# Patient Record
Sex: Female | Born: 1980
Health system: Southern US, Community
[De-identification: ages and names within clinical notes are randomized; demographics above are authoritative.]

## PROBLEM LIST (undated history)

## (undated) ENCOUNTER — Inpatient Hospital Stay (HOSPITAL_COMMUNITY): Payer: Self-pay

## (undated) HISTORY — PX: NO PAST SURGERIES: SHX2092

---

## 2012-11-04 ENCOUNTER — Other Ambulatory Visit (HOSPITAL_COMMUNITY)
Admission: RE | Admit: 2012-11-04 | Discharge: 2012-11-04 | Disposition: A | Discharge status: Home / Self Care | Payer: BC Managed Care – PPO | Source: Ambulatory Visit

## 2012-11-04 DIAGNOSIS — Z124 Encounter for screening for malignant neoplasm of cervix: Secondary | ICD-10-CM | POA: Insufficient documentation

## 2013-09-03 ENCOUNTER — Emergency Department (HOSPITAL_COMMUNITY): Payer: BC Managed Care – PPO

## 2013-09-03 ENCOUNTER — Emergency Department (HOSPITAL_COMMUNITY)
Admission: EM | Admit: 2013-09-03 | Discharge: 2013-09-03 | Disposition: A | Discharge status: Home / Self Care | Payer: BC Managed Care – PPO | Attending: Emergency Medicine | Admitting: Emergency Medicine

## 2013-09-03 ENCOUNTER — Encounter (HOSPITAL_COMMUNITY): Payer: Self-pay | Admitting: Emergency Medicine

## 2013-09-03 DIAGNOSIS — Y9389 Activity, other specified: Secondary | ICD-10-CM | POA: Insufficient documentation

## 2013-09-03 DIAGNOSIS — Y929 Unspecified place or not applicable: Secondary | ICD-10-CM | POA: Insufficient documentation

## 2013-09-03 DIAGNOSIS — S91109A Unspecified open wound of unspecified toe(s) without damage to nail, initial encounter: Secondary | ICD-10-CM | POA: Insufficient documentation

## 2013-09-03 DIAGNOSIS — S91119A Laceration without foreign body of unspecified toe without damage to nail, initial encounter: Secondary | ICD-10-CM

## 2013-09-03 DIAGNOSIS — W2203XA Walked into furniture, initial encounter: Secondary | ICD-10-CM | POA: Insufficient documentation

## 2013-09-03 MED ORDER — HYDROCODONE-ACETAMINOPHEN 5-325 MG PO TABS: 1.0000 | ORAL_TABLET | ORAL | Status: DC | PRN

## 2013-09-03 NOTE — ED Notes (Signed)
Pt from home. Was sitting in a chair, leaned backwards, and chair broke. Cut right middle and third toe. EMS called. EMS applied sterile dressing to the toes. Bleeding controlled at this time. Pt denies LOC or hitting head when she fell backwards.

## 2013-09-03 NOTE — Discharge Instructions (Signed)

## 2013-09-03 NOTE — ED Provider Notes (Signed)
Medical screening examination/treatment/procedure(s) were performed by non-physician practitioner and as supervising physician I was immediately available for consultation/collaboration.    Olivia Mackielga M Shauntel Prest, MD 09/03/13 Earle [id number]

## 2013-09-03 NOTE — ED Provider Notes (Signed)
CSN: [id number]     Arrival date & time 09/03/13  0018 History   First MD Initiated Contact with Patient 09/03/13 0030     Chief Complaint  Patient presents with  . Extremity Laceration     (Consider location/radiation/quality/duration/timing/severity/associated sxs/prior Treatment) HPI  Patient brought to the ED by EMS after sustaining a toe laceration to her right toe.She was sitting in a chair that somehow folded on itself and she injured her toe. She denies hitting her head, injuring her neck or loc. She was afraid that her "toe meat was hanging out, and i got scared". She is up to date on her tetanus. She says that her foot is not currently hurting that badly.  History reviewed. No pertinent past medical history. History reviewed. No pertinent past surgical history. No family history on file. History  Substance Use Topics  . Smoking status: Never Smoker   . Smokeless tobacco: Not on file  . Alcohol Use: Not on file   OB History   Grav Para Term Preterm Abortions TAB SAB Ect Mult Living                 Review of Systems  The patient denies anorexia, fever, weight loss,, vision loss, decreased hearing, hoarseness, chest pain, syncope, dyspnea on exertion, peripheral edema, balance deficits, hemoptysis, abdominal pain, melena, hematochezia, severe indigestion/heartburn, hematuria, incontinence, genital sores, muscle weakness, suspicious skin lesions, transient blindness, difficulty walking, depression, unusual weight change, abnormal bleeding, enlarged lymph nodes, angioedema, and breast masses.   Allergies  Review of patient's allergies indicates no known allergies.  Home Medications   Current Outpatient Rx  Name  Route  Sig  Dispense  Refill  . HYDROcodone-acetaminophen (NORCO/VICODIN) 5-325 MG per tablet   Oral   Take 1-2 tablets by mouth every 4 (four) hours as needed.   20 tablet   0    BP 123/74  Pulse 79  Temp(Src) 98.1 F (36.7 C) (Oral)  Ht 5\' 3"  (1.6 m)   Wt 214 lb (97.07 kg)  BMI 37.92 kg/m2  SpO2 100%  LMP 07/30/2013 Physical Exam  Nursing note and vitals reviewed. Constitutional: She appears well-developed and well-nourished. No distress.  HENT:  Head: Normocephalic and atraumatic.  Eyes: Pupils are equal, round, and reactive to light.  Neck: Normal range of motion. Neck supple.  Cardiovascular: Normal rate and regular rhythm.   Pulmonary/Chest: Effort normal.  Abdominal: Soft.  Musculoskeletal:  Index toe and middle toe are both tender to palpation and with ROM.  The posterior proximal metatarsal of the middle toe has a small laceration.  Neurological: She is alert.  Skin: Skin is warm and dry.      ED Course  Procedures (including critical care time) Labs Review Labs Reviewed - No data to display Imaging Review Dg Toe 3rd Right  09/03/2013   CLINICAL DATA:  Toe laceration.  EXAM: RIGHT THIRD TOE  COMPARISON:  None.  FINDINGS: There is no evidence of fracture or dislocation. There is no evidence of arthropathy or other focal bone abnormality. Soft tissues are unremarkable. No radiopaque foreign body.  IMPRESSION: Negative.   Electronically Signed   By: Charlett Nose M.D.   On: 09/03/2013 01:07    EKG Interpretation   None       MDM   Final diagnoses:  Toe laceration    LACERATION REPAIR Performed by: Dorthula Matas Authorized by: Dorthula Matas Consent: Verbal consent obtained. Risks and benefits: risks, benefits and alternatives were discussed Consent given by:  patient Patient identity confirmed: provided demographic data Prepped and Draped in normal sterile fashion Wound explored  Laceration Location: right middle and 2nd toe lacerations   Laceration Length: 2 cm  No Foreign Bodies seen or palpated  Anesthesia: local infiltration  Local anesthetic: lidocaine 2 % wo epinephrine  Anesthetic total: 5 ml  Irrigation method: syringe Amount of cleaning: standard  Skin closure:  sutures  Number of sutures: 5  Technique: simple interrupted   Patient tolerance: Patient tolerated the procedure well with no immediate complications.   I placed dermabond over the sutures for further support. Post-op boot to help keep sutures intact. Patient to keep foot dry for 3 years and then has been advised to start washing foot with soap and water so that glue starts to remove. Post-op boot at all times if walking until sutures removed.   Sutures to be removed in 7-10 days.  33 y.o.Pervis HockingLakeithia Denise Boberg's evaluation in the Emergency Department is complete. It has been determined that no acute conditions requiring further emergency intervention are present at this time. The patient/guardian have been advised of the diagnosis and plan. We have discussed signs and symptoms that warrant return to the ED, such as changes or worsening in symptoms.  Vital signs are stable at discharge. Filed Vitals:   09/03/13 0025  BP: 123/74  Pulse: 79  Temp: 98.1 F (36.7 C)    Patient/guardian has voiced understanding and agreed to follow-up with the PCP or specialist.   Dorthula Matasiffany G Joliyah Lippens, PA-C 09/03/13 0200

## 2014-07-22 ENCOUNTER — Emergency Department (HOSPITAL_COMMUNITY)
Admission: EM | Admit: 2014-07-22 | Discharge: 2014-07-22 | Disposition: A | Discharge status: Home / Self Care | Payer: BLUE CROSS/BLUE SHIELD | Attending: Emergency Medicine | Admitting: Emergency Medicine

## 2014-07-22 ENCOUNTER — Encounter (HOSPITAL_COMMUNITY): Payer: Self-pay | Admitting: *Deleted

## 2014-07-22 ENCOUNTER — Emergency Department (HOSPITAL_COMMUNITY): Payer: BLUE CROSS/BLUE SHIELD

## 2014-07-22 DIAGNOSIS — R1030 Lower abdominal pain, unspecified: Secondary | ICD-10-CM

## 2014-07-22 DIAGNOSIS — R109 Unspecified abdominal pain: Secondary | ICD-10-CM | POA: Insufficient documentation

## 2014-07-22 DIAGNOSIS — Z3490 Encounter for supervision of normal pregnancy, unspecified, unspecified trimester: Secondary | ICD-10-CM

## 2014-07-22 DIAGNOSIS — O210 Mild hyperemesis gravidarum: Secondary | ICD-10-CM | POA: Diagnosis not present

## 2014-07-22 DIAGNOSIS — O9989 Other specified diseases and conditions complicating pregnancy, childbirth and the puerperium: Secondary | ICD-10-CM | POA: Diagnosis not present

## 2014-07-22 DIAGNOSIS — Z3A01 Less than 8 weeks gestation of pregnancy: Secondary | ICD-10-CM | POA: Insufficient documentation

## 2014-07-22 DIAGNOSIS — O26899 Other specified pregnancy related conditions, unspecified trimester: Secondary | ICD-10-CM

## 2014-07-22 LAB — URINALYSIS, ROUTINE W REFLEX MICROSCOPIC
GLUCOSE, UA: NEGATIVE mg/dL
Leukocytes, UA: NEGATIVE
Nitrite: NEGATIVE
Protein, ur: 30 mg/dL — AB
SPECIFIC GRAVITY, URINE: 1.037 — AB (ref 1.005–1.030)
UROBILINOGEN UA: 0.2 mg/dL (ref 0.0–1.0)
pH: 6 (ref 5.0–8.0)

## 2014-07-22 LAB — CBC WITH DIFFERENTIAL/PLATELET
BASOS PCT: 0 % (ref 0–1)
Basophils Absolute: 0 10*3/uL (ref 0.0–0.1)
EOS PCT: 0 % (ref 0–5)
Eosinophils Absolute: 0 10*3/uL (ref 0.0–0.7)
HCT: 46.5 % — ABNORMAL HIGH (ref 36.0–46.0)
HEMOGLOBIN: 16.2 g/dL — AB (ref 12.0–15.0)
LYMPHS ABS: 1.4 10*3/uL (ref 0.7–4.0)
Lymphocytes Relative: 18 % (ref 12–46)
MCH: 30.1 pg (ref 26.0–34.0)
MCHC: 34.8 g/dL (ref 30.0–36.0)
MCV: 86.4 fL (ref 78.0–100.0)
MONO ABS: 0.8 10*3/uL (ref 0.1–1.0)
MONOS PCT: 11 % (ref 3–12)
NEUTROS ABS: 5.6 10*3/uL (ref 1.7–7.7)
Neutrophils Relative %: 71 % (ref 43–77)
Platelets: 287 10*3/uL (ref 150–400)
RBC: 5.38 MIL/uL — ABNORMAL HIGH (ref 3.87–5.11)
RDW: 13.6 % (ref 11.5–15.5)
WBC: 7.8 10*3/uL (ref 4.0–10.5)

## 2014-07-22 LAB — WET PREP, GENITAL
Clue Cells Wet Prep HPF POC: NONE SEEN
Trich, Wet Prep: NONE SEEN
Yeast Wet Prep HPF POC: NONE SEEN

## 2014-07-22 LAB — COMPREHENSIVE METABOLIC PANEL
ALK PHOS: 55 U/L (ref 39–117)
ALT: 16 U/L (ref 0–35)
AST: 23 U/L (ref 0–37)
Albumin: 4.3 g/dL (ref 3.5–5.2)
Anion gap: 15 (ref 5–15)
BILIRUBIN TOTAL: 0.7 mg/dL (ref 0.3–1.2)
BUN: 8 mg/dL (ref 6–23)
CALCIUM: 9.7 mg/dL (ref 8.4–10.5)
CHLORIDE: 102 meq/L (ref 96–112)
CO2: 18 mmol/L — ABNORMAL LOW (ref 19–32)
CREATININE: 0.68 mg/dL (ref 0.50–1.10)
GFR calc Af Amer: >90 mL/min (ref >90)
GFR calc non Af Amer: >90 mL/min (ref >90)
GLUCOSE: 92 mg/dL (ref 70–99)
POTASSIUM: 3.5 mmol/L (ref 3.5–5.1)
SODIUM: 135 mmol/L (ref 135–145)
TOTAL PROTEIN: 8.2 g/dL (ref 6.0–8.3)

## 2014-07-22 LAB — URINE MICROSCOPIC-ADD ON

## 2014-07-22 LAB — HCG, QUANTITATIVE, PREGNANCY: hCG, Beta Chain, Quant, S: 38545 m[IU]/mL — ABNORMAL HIGH (ref <5)

## 2014-07-22 LAB — LIPASE, BLOOD: LIPASE: 25 U/L (ref 11–59)

## 2014-07-22 LAB — PREGNANCY, URINE: PREG TEST UR: POSITIVE — AB

## 2014-07-22 MED ORDER — ONDANSETRON 4 MG PO TBDP: ORAL_TABLET | ORAL | Status: AC

## 2014-07-22 MED ORDER — PRENATAL COMPLETE 14-0.4 MG PO TABS: 14.0000 mg | ORAL_TABLET | Freq: Every day | ORAL | Status: AC

## 2014-07-22 MED ORDER — ONDANSETRON HCL 4 MG/2ML IJ SOLN
4.0000 mg | Freq: Once | INTRAMUSCULAR | Status: AC
  Administered 2014-07-22: 4 mg via INTRAVENOUS
  Filled 2014-07-22: qty 2

## 2014-07-22 MED ORDER — SODIUM CHLORIDE 0.9 % IV BOLUS (SEPSIS)
1000.0000 mL | Freq: Once | INTRAVENOUS | Status: AC
  Administered 2014-07-22: 1000 mL via INTRAVENOUS

## 2014-07-22 NOTE — Discharge Instructions (Signed)
Take Zofran as directed as needed for nausea. Take prenatal vitamins daily. Follow-up with the OB/GYN at your PCPs office, or women's hospital. Pelvic rest until follow up with ob/gyn (no intercourse).  First Trimester of Pregnancy The first trimester of pregnancy is from week 1 until the end of week 12 (months 1 through 3). A week after a sperm fertilizes an egg, the egg will implant on the wall of the uterus. This embryo will begin to develop into a baby. Genes from you and your partner are forming the baby. The female genes determine whether the baby is a boy or a girl. At 6-8 weeks, the eyes and face are formed, and the heartbeat can be seen on ultrasound. At the end of 12 weeks, all the baby's organs are formed.  Now that you are pregnant, you will want to do everything you can to have a healthy baby. Two of the most important things are to get good prenatal care and to follow your health care provider's instructions. Prenatal care is all the medical care you receive before the baby's birth. This care will help prevent, find, and treat any problems during the pregnancy and childbirth. BODY CHANGES Your body goes through many changes during pregnancy. The changes vary from woman to woman.   You may gain or lose a couple of pounds at first.  You may feel sick to your stomach (nauseous) and throw up (vomit). If the vomiting is uncontrollable, call your health care provider.  You may tire easily.  You may develop headaches that can be relieved by medicines approved by your health care provider.  You may urinate more often. Painful urination may mean you have a bladder infection.  You may develop heartburn as a result of your pregnancy.  You may develop constipation because certain hormones are causing the muscles that push waste through your intestines to slow down.  You may develop hemorrhoids or swollen, bulging veins (varicose veins).  Your breasts may begin to grow larger and become  tender. Your nipples may stick out more, and the tissue that surrounds them (areola) may become darker.  Your gums may bleed and may be sensitive to brushing and flossing.  Dark spots or blotches (chloasma, mask of pregnancy) may develop on your face. This will likely fade after the baby is born.  Your menstrual periods will stop.  You may have a loss of appetite.  You may develop cravings for certain kinds of food.  You may have changes in your emotions from day to day, such as being excited to be pregnant or being concerned that something may go wrong with the pregnancy and baby.  You may have more vivid and strange dreams.  You may have changes in your hair. These can include thickening of your hair, rapid growth, and changes in texture. Some women also have hair loss during or after pregnancy, or hair that feels dry or thin. Your hair will most likely return to normal after your baby is born. WHAT TO EXPECT AT YOUR PRENATAL VISITS During a routine prenatal visit:  You will be weighed to make sure you and the baby are growing normally.  Your blood pressure will be taken.  Your abdomen will be measured to track your baby's growth.  The fetal heartbeat will be listened to starting around week 10 or 12 of your pregnancy.  Test results from any previous visits will be discussed. Your health care provider may ask you:  How you are feeling.  If you are feeling the baby move.  If you have had any abnormal symptoms, such as leaking fluid, bleeding, severe headaches, or abdominal cramping.  If you have any questions. Other tests that may be performed during your first trimester include:  Blood tests to find your blood type and to check for the presence of any previous infections. They will also be used to check for low iron levels (anemia) and Rh antibodies. Later in the pregnancy, blood tests for diabetes will be done along with other tests if problems develop.  Urine tests to  check for infections, diabetes, or protein in the urine.  An ultrasound to confirm the proper growth and development of the baby.  An amniocentesis to check for possible genetic problems.  Fetal screens for spina bifida and Down syndrome.  You may need other tests to make sure you and the baby are doing well. HOME CARE INSTRUCTIONS  Medicines  Follow your health care provider's instructions regarding medicine use. Specific medicines may be either safe or unsafe to take during pregnancy.  Take your prenatal vitamins as directed.  If you develop constipation, try taking a stool softener if your health care provider approves. Diet  Eat regular, well-balanced meals. Choose a variety of foods, such as meat or vegetable-based protein, fish, milk and low-fat dairy products, vegetables, fruits, and whole grain breads and cereals. Your health care provider will help you determine the amount of weight gain that is right for you.  Avoid raw meat and uncooked cheese. These carry germs that can cause birth defects in the baby.  Eating four or five small meals rather than three large meals a day may help relieve nausea and vomiting. If you start to feel nauseous, eating a few soda crackers can be helpful. Drinking liquids between meals instead of during meals also seems to help nausea and vomiting.  If you develop constipation, eat more high-fiber foods, such as fresh vegetables or fruit and whole grains. Drink enough fluids to keep your urine clear or pale yellow. Activity and Exercise  Exercise only as directed by your health care provider. Exercising will help you:  Control your weight.  Stay in shape.  Be prepared for labor and delivery.  Experiencing pain or cramping in the lower abdomen or low back is a good sign that you should stop exercising. Check with your health care provider before continuing normal exercises.  Try to avoid standing for long periods of time. Move your legs often  if you must stand in one place for a long time.  Avoid heavy lifting.  Wear low-heeled shoes, and practice good posture.  You may continue to have sex unless your health care provider directs you otherwise. Relief of Pain or Discomfort  Wear a good support bra for breast tenderness.   Take warm sitz baths to soothe any pain or discomfort caused by hemorrhoids. Use hemorrhoid cream if your health care provider approves.   Rest with your legs elevated if you have leg cramps or low back pain.  If you develop varicose veins in your legs, wear support hose. Elevate your feet for 15 minutes, 3-4 times a day. Limit salt in your diet. Prenatal Care  Schedule your prenatal visits by the twelfth week of pregnancy. They are usually scheduled monthly at first, then more often in the last 2 months before delivery.  Write down your questions. Take them to your prenatal visits.  Keep all your prenatal visits as directed by your health care provider.  Safety  Wear your seat belt at all times when driving.  Make a list of emergency phone numbers, including numbers for family, friends, the hospital, and police and fire departments. General Tips  Ask your health care provider for a referral to a local prenatal education class. Begin classes no later than at the beginning of month 6 of your pregnancy.  Ask for help if you have counseling or nutritional needs during pregnancy. Your health care provider can offer advice or refer you to specialists for help with various needs.  Do not use hot tubs, steam rooms, or saunas.  Do not douche or use tampons or scented sanitary pads.  Do not cross your legs for long periods of time.  Avoid cat litter boxes and soil used by cats. These carry germs that can cause birth defects in the baby and possibly loss of the fetus by miscarriage or stillbirth.  Avoid all smoking, herbs, alcohol, and medicines not prescribed by your health care provider. Chemicals in  these affect the formation and growth of the baby.  Schedule a dentist appointment. At home, brush your teeth with a soft toothbrush and be gentle when you floss. SEEK MEDICAL CARE IF:   You have dizziness.  You have mild pelvic cramps, pelvic pressure, or nagging pain in the abdominal area.  You have persistent nausea, vomiting, or diarrhea.  You have a bad smelling vaginal discharge.  You have pain with urination.  You notice increased swelling in your face, hands, legs, or ankles. SEEK IMMEDIATE MEDICAL CARE IF:   You have a fever.  You are leaking fluid from your vagina.  You have spotting or bleeding from your vagina.  You have severe abdominal cramping or pain.  You have rapid weight gain or loss.  You vomit blood or material that looks like coffee grounds.  You are exposed to [place] measles and have never had them.  You are exposed to fifth disease or chickenpox.  You develop a severe headache.  You have shortness of breath.  You have any kind of trauma, such as from a fall or a car accident. Document Released: 06/27/2001 Document Revised: 11/17/2013 Document Reviewed: 05/13/2013 [facility] Patient Information 2015 [employer] [place]. This information is not intended to replace advice given to you by your health care provider. Make sure you discuss any questions you have with your health care provider.  Hyperemesis Gravidarum Hyperemesis gravidarum is a severe form of nausea and vomiting that happens during pregnancy. Hyperemesis is worse than morning sickness. It may cause you to have nausea or vomiting all day for many days. It may keep you from eating and drinking enough food and liquids. Hyperemesis usually occurs during the first half (the first 20 weeks) of pregnancy. It often goes away once a woman is in her second half of pregnancy. However, sometimes hyperemesis continues through an entire pregnancy.  CAUSES  The cause of this condition is not completely known  but is thought to be related to changes in the body's hormones when pregnant. It could be from the high level of the pregnancy hormone or an increase in estrogen in the body.  SIGNS AND SYMPTOMS   Severe nausea and vomiting.  Nausea that does not go away.  Vomiting that does not allow you to keep any food down.  Weight loss and body fluid loss (dehydration).  Having no desire to eat or not liking food you have previously enjoyed. DIAGNOSIS  Your health care provider will do a physical exam  and ask you about your symptoms. He or she may also order blood tests and urine tests to make sure something else is not causing the problem.  TREATMENT  You may only need medicine to control the problem. If medicines do not control the nausea and vomiting, you will be treated in the hospital to prevent dehydration, increased acid in the blood (acidosis), weight loss, and changes in the electrolytes in your body that may harm the unborn baby (fetus). You may need IV fluids.  HOME CARE INSTRUCTIONS   Only take over-the-counter or prescription medicines as directed by your health care provider.  Try eating a couple of dry crackers or toast in the morning before getting out of bed.  Avoid foods and smells that upset your stomach.  Avoid fatty and spicy foods.  Eat 5-6 small meals a day.  Do not drink when eating meals. Drink between meals.  For snacks, eat high-protein foods, such as cheese.  Eat or suck on things that have ginger in them. Ginger helps nausea.  Avoid food preparation. The smell of food can spoil your appetite.  Avoid iron pills and iron in your multivitamins until after 3-4 months of being pregnant. However, consult with your health care provider before stopping any prescribed iron pills. SEEK MEDICAL CARE IF:   Your abdominal pain increases.  You have a severe headache.  You have vision problems.  You are losing weight. SEEK IMMEDIATE MEDICAL CARE IF:   You are unable  to keep fluids down.  You vomit blood.  You have constant nausea and vomiting.  You have excessive weakness.  You have extreme thirst.  You have dizziness or fainting.  You have a fever or persistent symptoms for more than 2-3 days.  You have a fever and your symptoms suddenly get worse. MAKE SURE YOU:   Understand these instructions.  Will watch your condition.  Will get help right away if you are not doing well or get worse. Document Released: 07/03/2005 Document Revised: 04/23/2013 Document Reviewed: 02/12/2013 [facility] Patient Information 2015 [place], [place]. This information is not intended to replace advice given to you by your health care provider. Make sure you discuss any questions you have with your health care provider.

## 2014-07-22 NOTE — ED Provider Notes (Signed)
CSN: [id number]     Arrival date & time 07/22/14  1819 History   First MD Initiated Contact with Patient 07/22/14 1903     Chief Complaint  Patient presents with  . Abdominal Pain  . N/V      (Consider location/radiation/quality/duration/timing/severity/associated sxs/prior Treatment) HPI Comments: 34 year old female presenting with abdominal pain, nausea and vomiting 4 days. Abdominal pain radiates across her lower abdomen, constant, worse with certain movements. No alleviating factors. States she has not been able to eat or drink for the past 4 days and is unable to keep anything down. She urinated 4 times over the past few days. No diarrhea. Admits to subjective fever and chills. She went to her PCP earlier today, tested for the flu which was negative, sent to the ED for further evaluation. One month ago she reports being treated for a vaginal infection with both Diflucan and Flagyl, states the symptoms have not completely gone away. Admits to scant vaginal discharge. no vaginal bleeding. She is sexually active with one partner and uses protection. Last menstrual period began December 7 and only lasted 1 day.  Patient is a 34 y.o. female presenting with abdominal pain. The history is provided by the patient.  Abdominal Pain Associated symptoms: chills, fever, nausea, vaginal discharge and vomiting     History reviewed. No pertinent past medical history. History reviewed. No pertinent past surgical history. History reviewed. No pertinent family history. History  Substance Use Topics  . Smoking status: Never Smoker   . Smokeless tobacco: Not on file  . Alcohol Use: Not on file   OB History    No data available     Review of Systems  Constitutional: Positive for fever, chills and appetite change.  Gastrointestinal: Positive for nausea, vomiting and abdominal pain.  Genitourinary: Positive for decreased urine volume and vaginal discharge.  All other systems reviewed and are  negative.     Allergies  Review of patient's allergies indicates no known allergies.  Home Medications   Prior to Admission medications   Medication Sig Start Date End Date Taking? Authorizing Provider  HYDROcodone-acetaminophen (NORCO/VICODIN) 5-325 MG per tablet Take 1-2 tablets by mouth every 4 (four) hours as needed. 09/03/13   Dorthula Matas, PA-C  ondansetron (ZOFRAN ODT) 4 MG disintegrating tablet  ODT q4 hours prn nausea/vomit 07/22/14   Kathrynn Speed, PA-C  Prenatal Vit-Fe Fumarate-FA (PRENATAL COMPLETE) 14-0.4 MG TABS Take 14 mg by mouth daily. 07/22/14   Emmilia Sowder M Willow Reczek, PA-C   BP 100/48 mmHg  Pulse 65  Temp(Src) 98.4 F (36.9 C) (Oral)  Resp 14  Ht  (1.6 m)  Wt 176 lb (79.833 kg)  BMI 31.18 kg/m2  SpO2 98% Physical Exam  Constitutional: She is oriented to person, place, and time. She appears well-developed and well-nourished. No distress.  HENT:  Head: Normocephalic and atraumatic.  Dry MM.  Eyes: Conjunctivae are normal.  Neck: Normal range of motion. Neck supple.  Cardiovascular: Normal rate, regular rhythm and normal heart sounds.   Pulmonary/Chest: Effort normal and breath sounds normal.  Abdominal: Soft. Normal appearance and bowel sounds are normal. There is tenderness. There is no rigidity, no rebound and no guarding.  TTP across lower abdomen. No peritoneal signs.  Genitourinary: Uterus is tender. Cervix exhibits discharge. Cervix exhibits no motion tenderness. Right adnexum displays tenderness. Left adnexum displays tenderness. No tenderness or bleeding in the vagina. Vaginal discharge (yellow/white) found.  Cervical os closed.  Musculoskeletal: Normal range of motion. She exhibits no  edema.  Neurological: She is alert and oriented to person, place, and time.  Skin: Skin is warm and dry. She is not diaphoretic.  Psychiatric: She has a normal mood and affect. Her behavior is normal.  Nursing note and vitals reviewed.   ED Course  Procedures  (including critical care time) Labs Review Labs Reviewed  WET PREP, GENITAL - Abnormal; Notable for the following:    WBC, Wet Prep HPF POC FEW (*)    All other components within normal limits  COMPREHENSIVE METABOLIC PANEL - Abnormal; Notable for the following:    CO2 18 (*)    All other components within normal limits  CBC WITH DIFFERENTIAL - Abnormal; Notable for the following:    RBC 5.38 (*)    Hemoglobin 16.2 (*)    HCT 46.5 (*)    All other components within normal limits  URINALYSIS, ROUTINE W REFLEX MICROSCOPIC - Abnormal; Notable for the following:    Color, Urine AMBER (*)    APPearance CLOUDY (*)    Specific Gravity, Urine 1.037 (*)    Hgb urine dipstick SMALL (*)    Bilirubin Urine SMALL (*)    Ketones, ur >80 (*)    Protein, ur 30 (*)    All other components within normal limits  PREGNANCY, URINE - Abnormal; Notable for the following:    Preg Test, Ur POSITIVE (*)    All other components within normal limits  URINE MICROSCOPIC-ADD ON - Abnormal; Notable for the following:    Squamous Epithelial / LPF MANY (*)    Bacteria, UA FEW (*)    All other components within normal limits  HCG, QUANTITATIVE, PREGNANCY - Abnormal; Notable for the following:    hCG, Beta Chain, Quant, S 16109 (*)    All other components within normal limits  GC/CHLAMYDIA PROBE AMP  LIPASE, BLOOD    Imaging Review US Ob Comp Less 14 Wks  07/22/2014   CLINICAL DATA:  Acute onset of lower abdominal pain. Initial encounter.  EXAM: OBSTETRIC <14 WK [place] AND TRANSVAGINAL OB US  TECHNIQUE: Both transabdominal and transvaginal ultrasound examinations were performed for complete evaluation of the gestation as well as the maternal uterus, adnexal regions, and pelvic cul-de-sac. Transvaginal technique was performed to assess early pregnancy.  COMPARISON:  None.  FINDINGS: Intrauterine gestational sac: Visualized/normal in shape.  Yolk sac:  Yes  Embryo:  Yes  Cardiac Activity: Yes  Heart Rate: 136 bpm  CRL:    8.0  mm   6 w 5 d                  [place] EDC: 03/12/2015  Maternal uterus/adnexae: A small amount of subchorionic hemorrhage is noted distal to the gestational sac. The uterus is otherwise unremarkable.  The ovaries are within normal limits. The right ovary measures 2.6 x 1.9 x 2.1 cm, while the left ovary measures 4.0 x 1.3 x 2.1 cm. No suspicious adnexal masses are seen; there is no evidence for ovarian torsion.  No free fluid is seen within the pelvic cul-de-sac.  IMPRESSION: 1. Single live intrauterine pregnancy noted, with a crown-rump length of 8 mm, corresponding to a gestational age of [redacted] weeks 5 days. This does not match the gestational age by LMP, and reflects a new estimated date of delivery of March 12, 2015. 2. Small amount of subchorionic hemorrhage noted.   Electronically Signed   By: Roanna Raider M.D.   On: 07/22/2014 21:45   US Ob Transvaginal  07/22/2014  CLINICAL DATA:  Acute onset of lower abdominal pain. Initial encounter.  EXAM: OBSTETRIC <14 WK US AND TRANSVAGINAL OB US  TECHNIQUE: Both transabdominal and transvaginal ultrasound examinations were performed for complete evaluation of the gestation as well as the maternal uterus, adnexal regions, and pelvic cul-de-sac. Transvaginal technique was performed to assess early pregnancy.  COMPARISON:  None.  FINDINGS: Intrauterine gestational sac: Visualized/normal in shape.  Yolk sac:  Yes  Embryo:  Yes  Cardiac Activity: Yes  Heart Rate: 136 bpm  CRL:   8.0  mm   6 w 5 d                  US EDC: 03/12/2015  Maternal uterus/adnexae: A small amount of subchorionic hemorrhage is noted distal to the gestational sac. The uterus is otherwise unremarkable.  The ovaries are within normal limits. The right ovary measures 2.6 x 1.9 x 2.1 cm, while the left ovary measures 4.0 x 1.3 x 2.1 cm. No suspicious adnexal masses are seen; there is no evidence for ovarian torsion.  No free fluid is seen within the pelvic cul-de-sac.  IMPRESSION: 1. Single live  intrauterine pregnancy noted, with a crown-rump length of 8 mm, corresponding to a gestational age of [redacted] weeks 5 days. This does not match the gestational age by LMP, and reflects a new estimated date of delivery of March 12, 2015. 2. Small amount of subchorionic hemorrhage noted.   Electronically Signed   By: Roanna RaiderJeffery  Chang M.D.   On: 07/22/2014 21:45     EKG Interpretation None      MDM   Final diagnoses:  Abdominal pain during pregnancy  Hyperemesis gravidarum  Intrauterine pregnancy   Pt in NAD. AFVSS. Lower abdominal pain, nausea, vomiting and decreased appetite for 4 days. Recent treatment for vaginitis. Labs obtained in triage prior to patient being seen, no acute finding. She appears dry. IV fluids started, Zofran given. Urinalysis contaminated with squamous epithelial cells, no evidence of infection. Urine pregnancy positive. After discussion with patient about positive pregnancy, patient states she has an 32-year-old son and has had two elective abortions in the past. Plan to obtain pelvic ultrasound to rule out ectopic pregnancy. 10:06 PM Ultrasound showing single live intrauterine pregnancy, with a crown-rump length of 8 mm, corresponding to a gestational age of [redacted] weeks and 5 days. Small amount of subchorionic hemorrhage. After receiving IV fluids and Zofran, patient reports she feels "so much better". Tolerates PO. Abdomen remains soft, mild tenderness across lower abdomen, improved from initial exam. Will discharge patient home with prenatal vitamins and Zofran. She states there is an OB/GYN at her PCPs office who she will follow-up with. If symptoms worsen, [facility]. Stable for discharge. Return precautions given. Patient states understanding of treatment care plan and is agreeable.  Kathrynn SpeedRobyn M Harce Volden, PA-C 07/22/14 2207  Kathrynn Speedobyn M Cierra Rothgeb, PA-C 07/22/14 2207  Gerhard Munchobert Lockwood, MD 07/23/14 [phone]

## 2014-07-22 NOTE — ED Notes (Signed)
[place] in [place], and she will add HCG to blood on file

## 2014-07-22 NOTE — ED Notes (Signed)
Pt in c/o abd pain with n/v for the last three days, pain is to lower abdomen, went to PCP and tested negative for flu, sent here for further evaluation

## 2014-07-24 ENCOUNTER — Telehealth: Payer: Self-pay | Admitting: *Deleted

## 2014-07-24 LAB — GC/CHLAMYDIA PROBE AMP
CT Probe RNA: NEGATIVE
GC Probe RNA: NEGATIVE

## 2014-07-24 NOTE — Telephone Encounter (Signed)
Pharmacy called r/t not having prescribed prenatal vitamin in stock.  Asked if ok to change to comparable prenatal vitamin in stock.  NCM verified with PA for change.

## 2015-06-21 IMAGING — CR DG TOE 3RD 2+V*R*
3 series · 3 of 3 positions shown · non-contrast
Comparison: None.

CLINICAL DATA: Toe laceration.

EXAM:
RIGHT THIRD TOE

[x toes ap right]
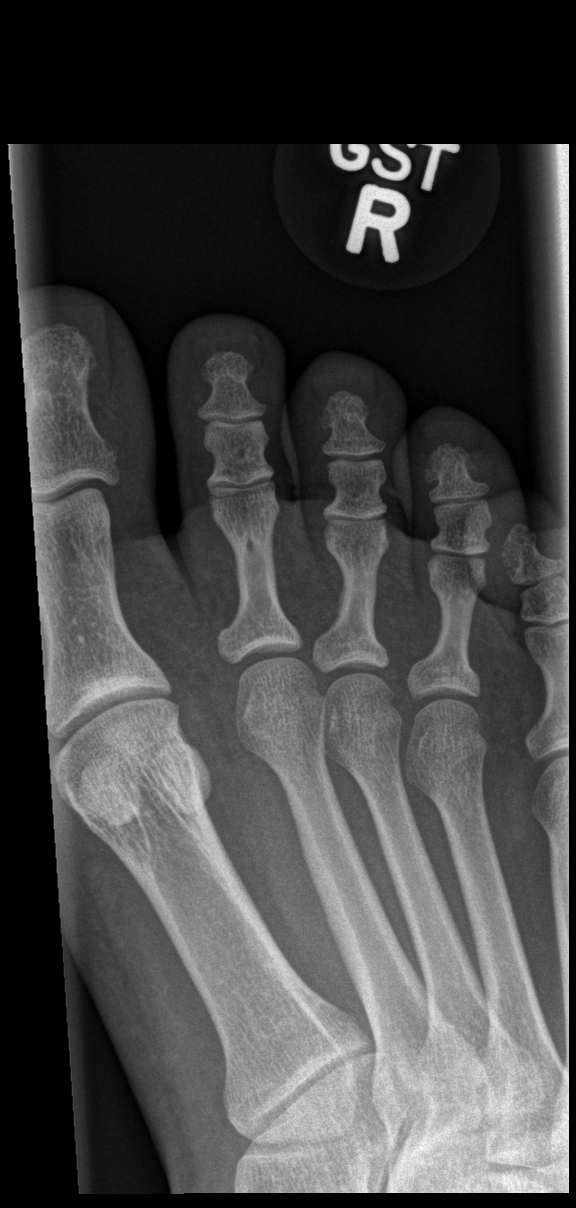

[x toes obl right]
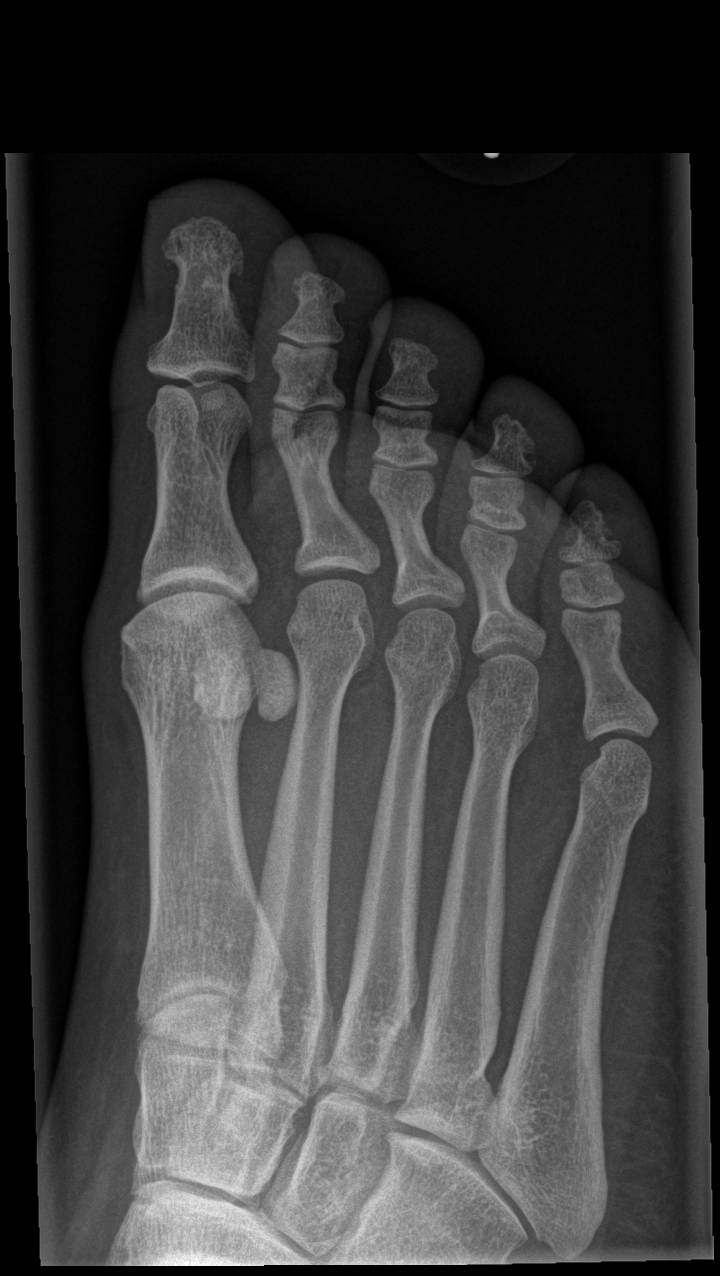

[x toes lat right]
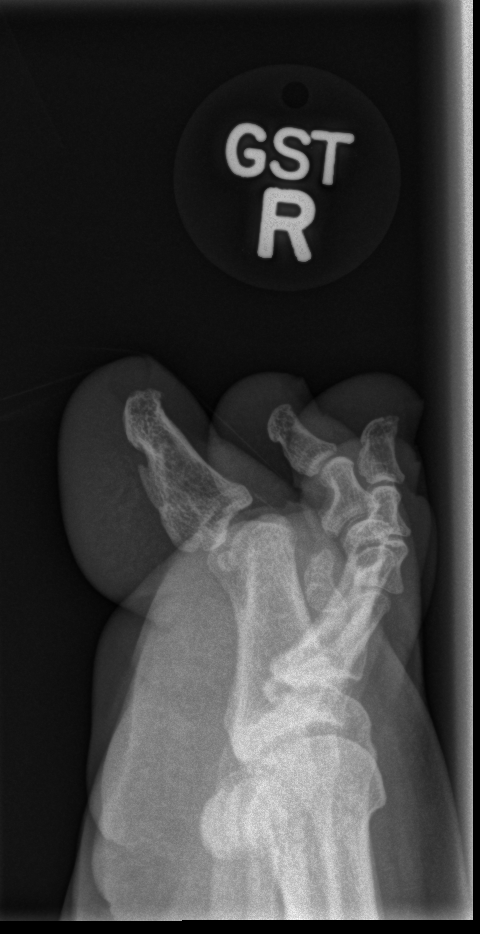

[3 of 3 positions shown; findings below may reference images not displayed]

FINDINGS: There is no evidence of fracture or dislocation. There is no
evidence of arthropathy or other focal bone abnormality. Soft
tissues are unremarkable. No radiopaque foreign body.
IMPRESSION: Negative.

## 2016-02-14 ENCOUNTER — Other Ambulatory Visit: Payer: Self-pay | Admitting: Obstetrics and Gynecology

## 2016-02-14 ENCOUNTER — Other Ambulatory Visit (HOSPITAL_COMMUNITY)
Admission: RE | Admit: 2016-02-14 | Discharge: 2016-02-14 | Disposition: A | Discharge status: Home / Self Care | Payer: Managed Care, Other (non HMO) | Source: Ambulatory Visit | Attending: Obstetrics and Gynecology | Admitting: Obstetrics and Gynecology

## 2016-02-14 DIAGNOSIS — Z01419 Encounter for gynecological examination (general) (routine) without abnormal findings: Secondary | ICD-10-CM | POA: Diagnosis present

## 2016-02-14 DIAGNOSIS — Z113 Encounter for screening for infections with a predominantly sexual mode of transmission: Secondary | ICD-10-CM | POA: Diagnosis present

## 2016-02-14 DIAGNOSIS — Z1151 Encounter for screening for human papillomavirus (HPV): Secondary | ICD-10-CM | POA: Insufficient documentation

## 2016-02-17 LAB — CYTOLOGY - PAP

## 2016-05-08 IMAGING — US US OB COMP LESS 14 WK
1 series · 13 of 28 positions shown · non-contrast
Comparison: None.

CLINICAL DATA: Acute onset of lower abdominal pain. Initial
encounter.

EXAM:
OBSTETRIC <14 WK US AND TRANSVAGINAL OB US
TECHNIQUE: Both transabdominal and transvaginal ultrasound examinations were
performed for complete evaluation of the gestation as well as the
maternal uterus, adnexal regions, and pelvic cul-de-sac.
Transvaginal technique was performed to assess early pregnancy.

[Series 1: us ob comp less 14 wk · 0.22mm/px · 13 of 36 slices shown]
[im 2/36]
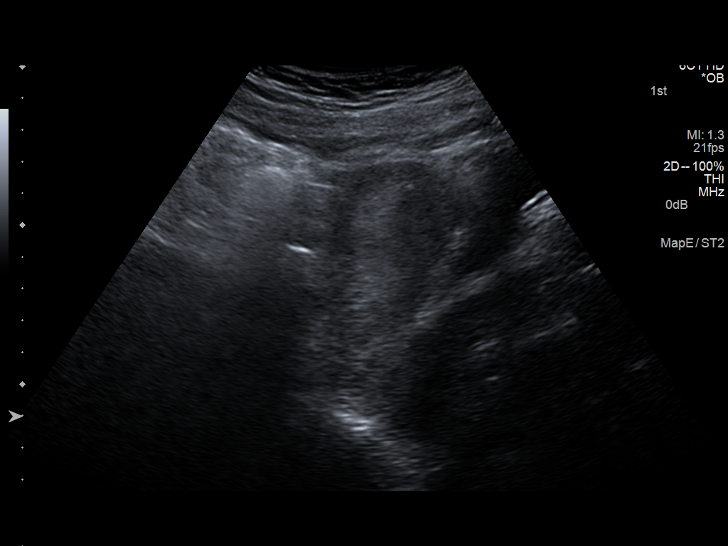
[im 4/36]
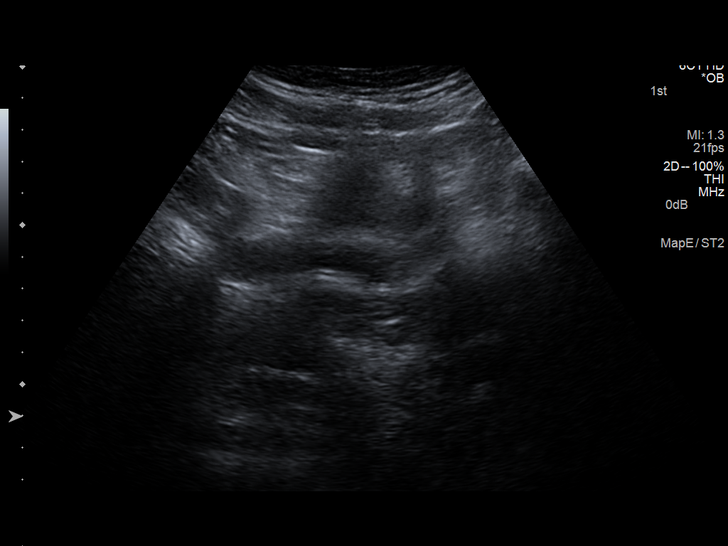
[im 7/36]
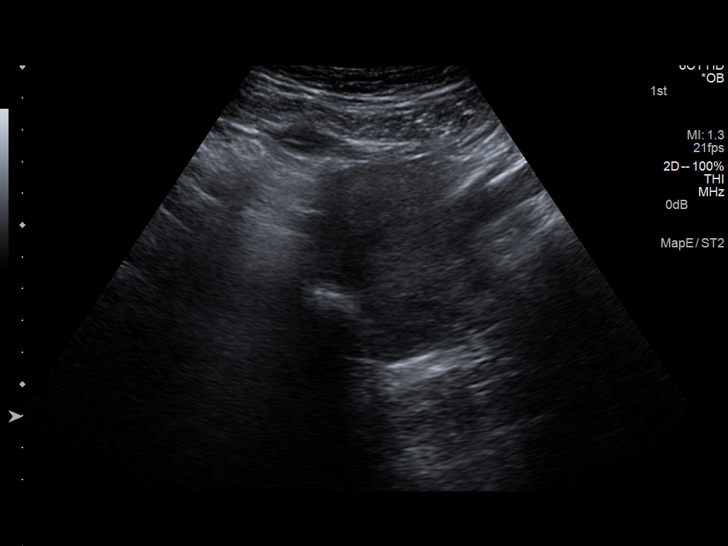
[im 10/36]
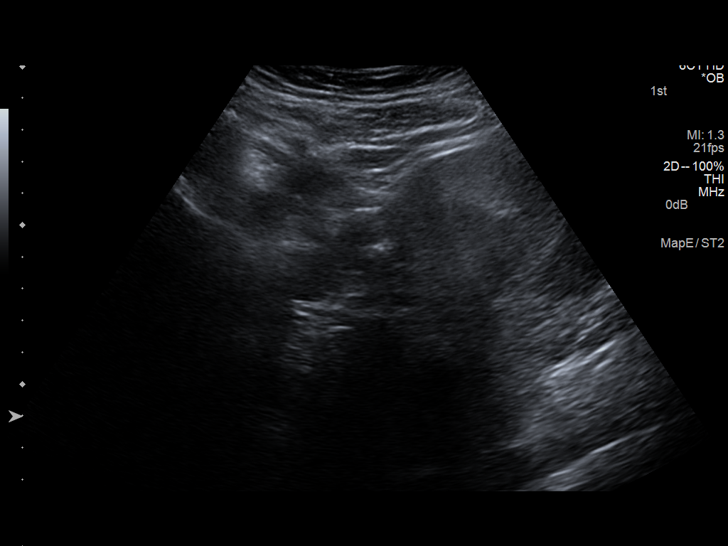
[im 12/36]
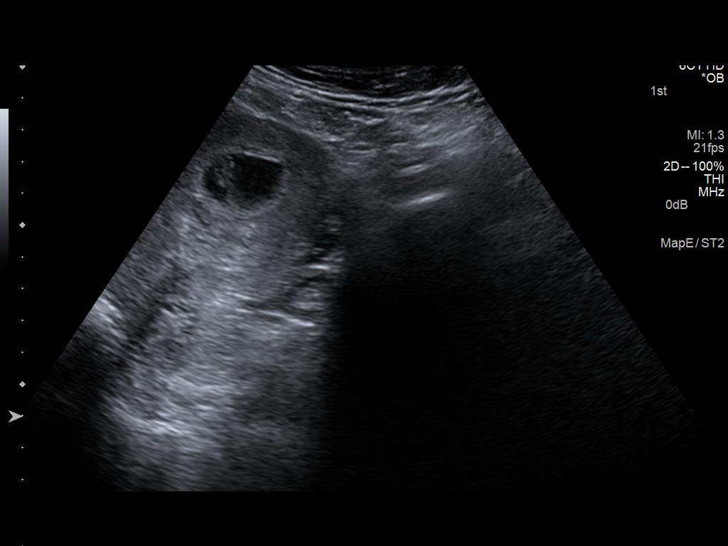
[im 15/36]
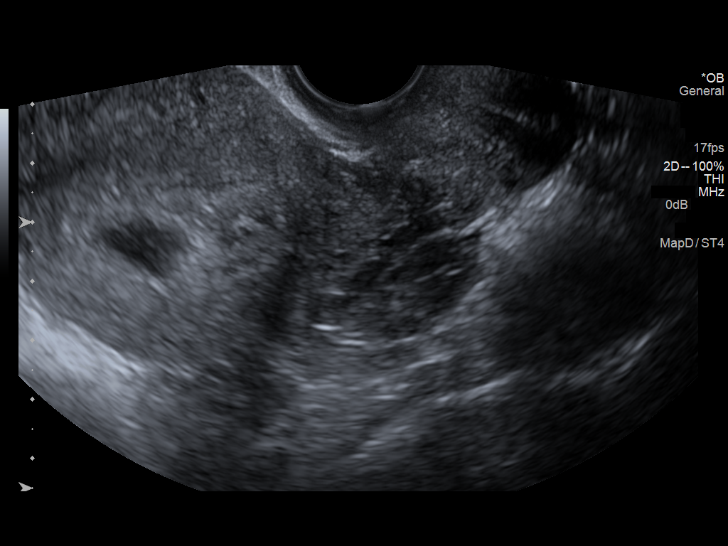
[im 19/36]
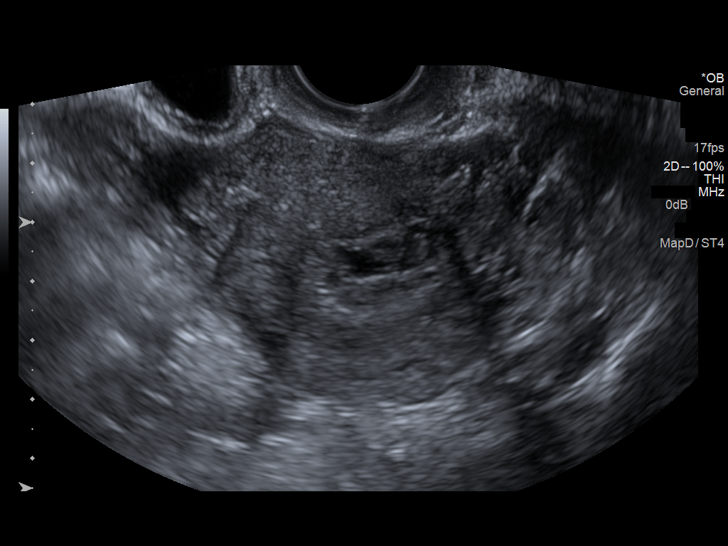
[im 21/36]
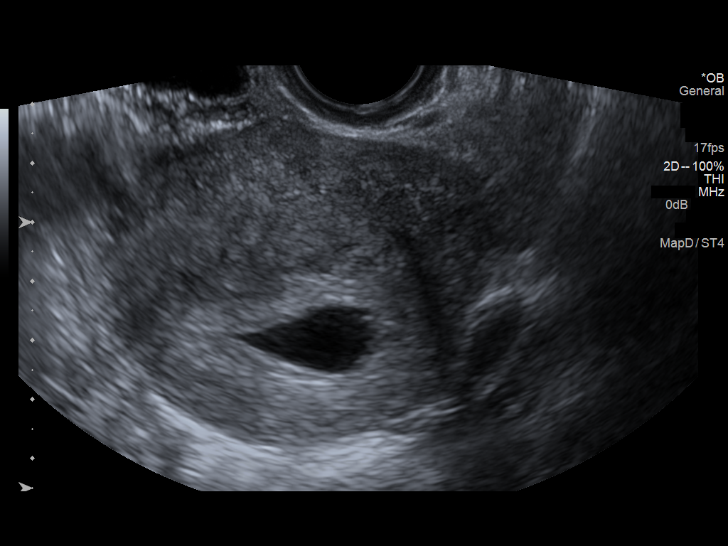
[im 24/36]
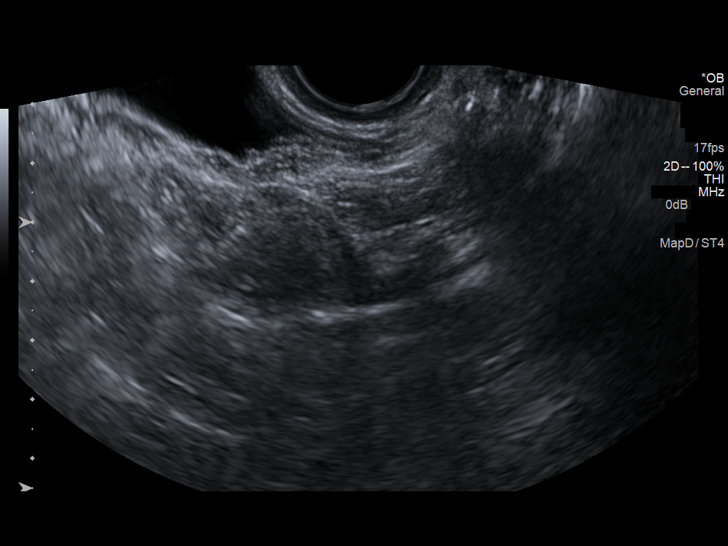
[im 26/36]
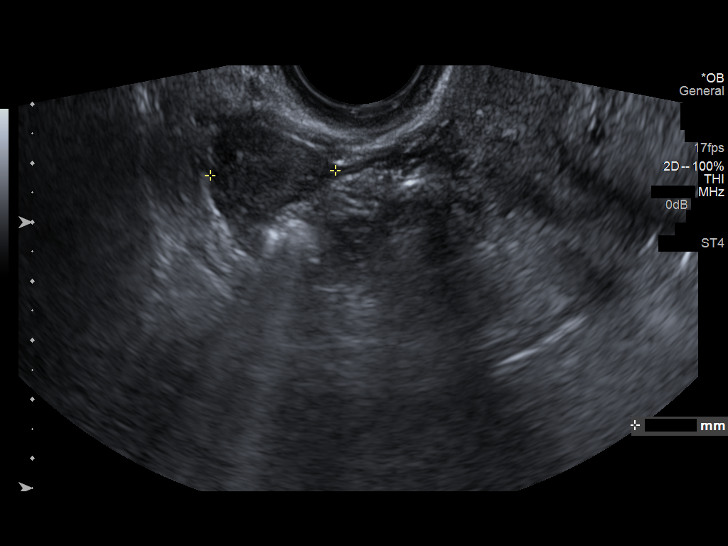
[im 29/36]
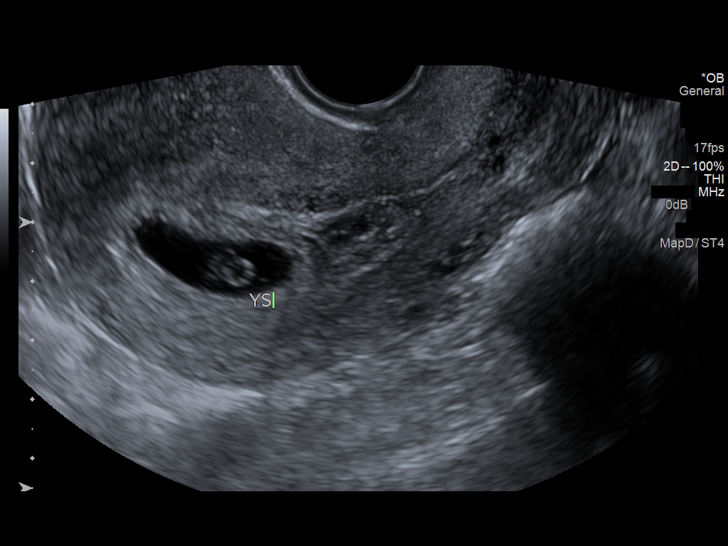
[im 32/36]
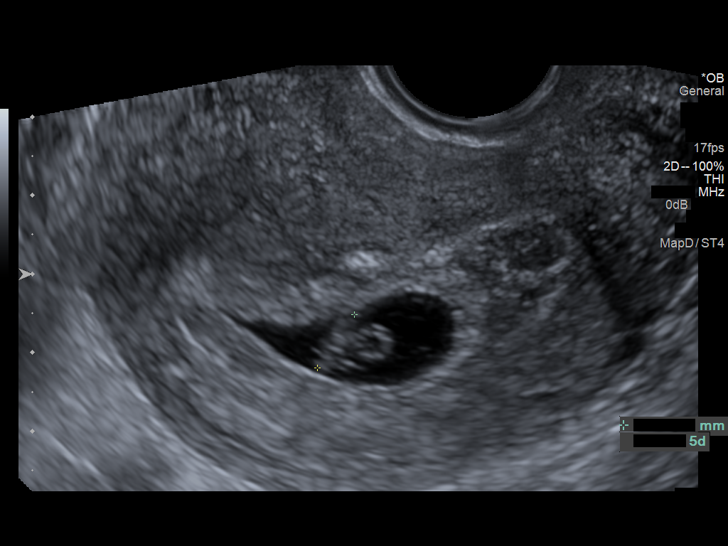
[im 34/36]
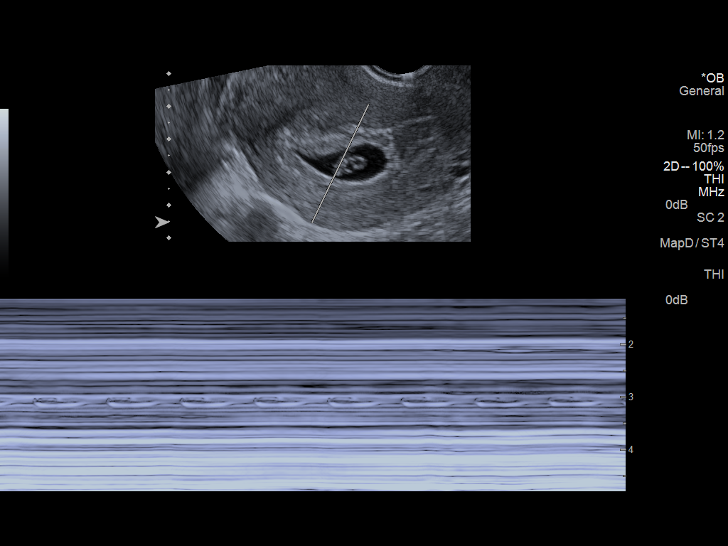

[13 of 28 positions shown; findings below may reference images not displayed]

FINDINGS: Intrauterine gestational sac: Visualized/normal in shape.

Yolk sac:  Yes

Embryo:  Yes

Cardiac Activity: Yes

Heart Rate: 136 bpm

CRL:   8.0  mm   6 w 5 d                  US EDC: 03/12/2015

Maternal uterus/adnexae: A small amount of subchorionic hemorrhage
is noted distal to the gestational sac. The uterus is otherwise
unremarkable.

The ovaries are within normal limits. The right ovary measures 2.6 x
1.9 x 2.1 cm, while the left ovary measures 4.0 x 1.3 x 2.1 cm. No
suspicious adnexal masses are seen; there is no evidence for ovarian
torsion.

No free fluid is seen within the pelvic cul-de-sac.
IMPRESSION: 1. Single live intrauterine pregnancy noted, with a crown-rump
length of 8 mm, corresponding to a gestational age of 6 weeks 5
days. This does not match the gestational age by LMP, and reflects a
new estimated date of delivery March 12, 2015.
2. Small amount of subchorionic hemorrhage noted.

## 2017-03-13 ENCOUNTER — Encounter: Payer: Self-pay | Admitting: Neurology

## 2017-06-25 ENCOUNTER — Ambulatory Visit: Payer: BLUE CROSS/BLUE SHIELD | Admitting: Neurology

## 2017-07-23 ENCOUNTER — Ambulatory Visit: Payer: BLUE CROSS/BLUE SHIELD | Admitting: Neurology

## 2018-04-17 ENCOUNTER — Other Ambulatory Visit: Payer: Self-pay | Admitting: Obstetrics and Gynecology

## 2018-08-02 DIAGNOSIS — Z3201 Encounter for pregnancy test, result positive: Secondary | ICD-10-CM | POA: Diagnosis not present

## 2018-08-06 DIAGNOSIS — R1011 Right upper quadrant pain: Secondary | ICD-10-CM | POA: Diagnosis not present

## 2018-08-06 DIAGNOSIS — O26891 Other specified pregnancy related conditions, first trimester: Secondary | ICD-10-CM | POA: Diagnosis not present

## 2018-08-06 DIAGNOSIS — O219 Vomiting of pregnancy, unspecified: Secondary | ICD-10-CM | POA: Diagnosis not present

## 2018-08-06 DIAGNOSIS — O210 Mild hyperemesis gravidarum: Secondary | ICD-10-CM | POA: Diagnosis not present

## 2018-08-06 DIAGNOSIS — Z888 Allergy status to other drugs, medicaments and biological substances status: Secondary | ICD-10-CM | POA: Diagnosis not present

## 2018-08-06 DIAGNOSIS — Z3A01 Less than 8 weeks gestation of pregnancy: Secondary | ICD-10-CM | POA: Diagnosis not present

## 2018-08-07 DIAGNOSIS — R1011 Right upper quadrant pain: Secondary | ICD-10-CM | POA: Diagnosis not present

## 2018-08-09 ENCOUNTER — Observation Stay (HOSPITAL_COMMUNITY)
Admission: AD | Admit: 2018-08-09 | Discharge: 2018-08-11 | Disposition: A | Discharge status: Home / Self Care | Payer: BLUE CROSS/BLUE SHIELD | Source: Ambulatory Visit | Attending: Obstetrics and Gynecology | Admitting: Obstetrics and Gynecology

## 2018-08-09 ENCOUNTER — Other Ambulatory Visit: Payer: Self-pay

## 2018-08-09 ENCOUNTER — Encounter (HOSPITAL_COMMUNITY): Payer: Self-pay

## 2018-08-09 DIAGNOSIS — Z3481 Encounter for supervision of other normal pregnancy, first trimester: Secondary | ICD-10-CM | POA: Diagnosis not present

## 2018-08-09 DIAGNOSIS — Z3A01 Less than 8 weeks gestation of pregnancy: Secondary | ICD-10-CM | POA: Diagnosis not present

## 2018-08-09 DIAGNOSIS — Z3201 Encounter for pregnancy test, result positive: Secondary | ICD-10-CM | POA: Diagnosis not present

## 2018-08-09 DIAGNOSIS — E876 Hypokalemia: Secondary | ICD-10-CM | POA: Insufficient documentation

## 2018-08-09 DIAGNOSIS — O210 Mild hyperemesis gravidarum: Secondary | ICD-10-CM | POA: Diagnosis not present

## 2018-08-09 DIAGNOSIS — O09891 Supervision of other high risk pregnancies, first trimester: Secondary | ICD-10-CM

## 2018-08-09 DIAGNOSIS — Z3490 Encounter for supervision of normal pregnancy, unspecified, unspecified trimester: Secondary | ICD-10-CM | POA: Diagnosis not present

## 2018-08-09 LAB — BASIC METABOLIC PANEL
Anion gap: 11 (ref 5–15)
BUN: 7 mg/dL (ref 6–20)
CO2: 25 mmol/L (ref 22–32)
Calcium: 8.8 mg/dL — ABNORMAL LOW (ref 8.9–10.3)
Chloride: 98 mmol/L (ref 98–111)
Creatinine, Ser: 0.71 mg/dL (ref 0.44–1.00)
GFR calc Af Amer: >60 mL/min (ref >60)
Glucose, Bld: 100 mg/dL — ABNORMAL HIGH (ref 70–99)
Potassium: 2.9 mmol/L — ABNORMAL LOW (ref 3.5–5.1)
Sodium: 134 mmol/L — ABNORMAL LOW (ref 135–145)

## 2018-08-09 LAB — URINALYSIS, ROUTINE W REFLEX MICROSCOPIC
Bilirubin Urine: NEGATIVE
Glucose, UA: >=500 mg/dL — AB
Hgb urine dipstick: NEGATIVE
Ketones, ur: 80 mg/dL — AB
Leukocytes, UA: NEGATIVE
Nitrite: NEGATIVE
Protein, ur: NEGATIVE mg/dL
Specific Gravity, Urine: 1.028 (ref 1.005–1.030)
pH: 6 (ref 5.0–8.0)

## 2018-08-09 LAB — LIPASE, BLOOD: LIPASE: 31 U/L (ref 11–51)

## 2018-08-09 LAB — AMYLASE: Amylase: 59 U/L (ref 28–100)

## 2018-08-09 MED ORDER — ACETAMINOPHEN 325 MG PO TABS: 650.0000 mg | ORAL_TABLET | ORAL | Status: DC | PRN

## 2018-08-09 MED ORDER — CALCIUM CARBONATE ANTACID 500 MG PO CHEW: 2.0000 | CHEWABLE_TABLET | ORAL | Status: DC | PRN

## 2018-08-09 MED ORDER — PRENATAL MULTIVITAMIN CH: 1.0000 | ORAL_TABLET | Freq: Every day | ORAL | Status: DC

## 2018-08-09 MED ORDER — DEXTROSE 5 % IN LACTATED RINGERS IV BOLUS
1000.0000 mL | Freq: Once | INTRAVENOUS | Status: AC
  Administered 2018-08-09: 1000 mL via INTRAVENOUS

## 2018-08-09 MED ORDER — ZOLPIDEM TARTRATE 5 MG PO TABS: 5.0000 mg | ORAL_TABLET | Freq: Every evening | ORAL | Status: DC | PRN

## 2018-08-09 MED ORDER — PROMETHAZINE HCL 25 MG/ML IJ SOLN: 12.5000 mg | INTRAMUSCULAR | Status: DC | PRN

## 2018-08-09 MED ORDER — LACTATED RINGERS IV SOLN
INTRAVENOUS | Status: DC
  Administered 2018-08-09 – 2018-08-11 (×6): via INTRAVENOUS

## 2018-08-09 MED ORDER — ONDANSETRON HCL 4 MG/2ML IJ SOLN
4.0000 mg | Freq: Three times a day (TID) | INTRAMUSCULAR | Status: DC
  Administered 2018-08-09 – 2018-08-10 (×4): 4 mg via INTRAVENOUS
  Filled 2018-08-09 (×4): qty 2

## 2018-08-09 MED ORDER — PYRIDOXINE HCL 100 MG/ML IJ SOLN
50.0000 mg | Freq: Two times a day (BID) | INTRAMUSCULAR | Status: DC
  Administered 2018-08-09 – 2018-08-10 (×3): 50 mg via INTRAVENOUS
  Filled 2018-08-09 (×5): qty 0.5

## 2018-08-09 MED ORDER — POTASSIUM CHLORIDE 2 MEQ/ML IV SOLN
INTRAVENOUS | Status: DC
  Filled 2018-08-09 (×4): qty 500

## 2018-08-09 MED ORDER — DOCUSATE SODIUM 100 MG PO CAPS
100.0000 mg | ORAL_CAPSULE | Freq: Every day | ORAL | Status: DC
Start: 2018-08-09 — End: 2018-08-11
  Filled 2018-08-09: qty 1

## 2018-08-09 NOTE — H&P (Signed)
Julie Carr is a 38 y.o. female G6 P1041 @ 7 4/7 weeks by ultrasound on 08/06/18 admitted for hyperemesis. Pt reports she has been vomiting "constantly" for the last 2 days.  Pt was seen at [facility] for the same issue on 08/07/18.  She states she received 3 bags of fluids and given IV medication.  She was tolerating liquids at discharge.  She reports the next morning vomiting resumed.  She was prescribed Diclegis but could not fill b/c it was very expensive.  Pt reports she vomited when she arrived to hospital ~2-3 hours ago but she has not vomited since IV medications started.  Pt reports she has vomited over 5 times this am.  She can not hold down water.  She feels weak and lethargic. Pt denies cramping and bleeding.  OB History    Gravida  5   Para  1   Term  1   Preterm  0   AB  4   Living  1     SAB  1   TAB  3   Ectopic  0   Multiple  0   Live Births  1          History reviewed. No pertinent past medical history. No past surgical history on file. Family History: family history is not on file. Social History:  reports that she has never smoked. She does not have any smokeless tobacco history on file. She reports that she does not use drugs. No history on file for alcohol.   Review of Systems  Constitutional: Negative for malaise/fatigue.  Gastrointestinal: Positive for nausea and vomiting.   Maternal Medical History:  Reason for admission: Nausea.       Blood pressure 121/72, pulse 70, temperature 97.8 F (36.6 C), temperature source Oral, resp. rate 18, height 5\' 3"  (1.6 m), weight 88 kg, SpO2 100 %. Maternal Exam:  Abdomen: Patient reports no abdominal tenderness.   Physical Exam  Constitutional: She is oriented to person, place, and time. She appears well-developed and well-nourished. She appears distressed.  Pt appears weak  HENT:  Head: Normocephalic and atraumatic.  Eyes sunken. Dark circles under eyes.  Neck: Normal range of  motion.  Cardiovascular: Normal rate and regular rhythm.  Respiratory: Effort normal and breath sounds normal.  GI: Soft. There is no abdominal tenderness.  Musculoskeletal: Normal range of motion.        General: No tenderness or edema.  Neurological: She is alert and oriented to person, place, and time.  Skin: Skin is warm.  Lips dry and cracked.    CMP     Component Value Date/Time   NA 134 (L) 08/09/2018 1237   K 2.9 (L) 08/09/2018 1237   CL 98 08/09/2018 1237   CO2 25 08/09/2018 1237   GLUCOSE 100 (H) 08/09/2018 1237   BUN 7 08/09/2018 1237   CREATININE 0.71 08/09/2018 1237   CALCIUM 8.8 (L) 08/09/2018 1237   PROT 8.2 07/22/2014 1836   ALBUMIN 4.3 07/22/2014 1836   AST 23 07/22/2014 1836   ALT 16 07/22/2014 1836   ALKPHOS 55 07/22/2014 1836   BILITOT 0.7 07/22/2014 1836   GFRNONAA >60 08/09/2018 1237   GFRAA >60 08/09/2018 1237   Urinalysis    Component Value Date/Time   COLORURINE YELLOW 08/09/2018 1538   APPEARANCEUR HAZY (A) 08/09/2018 1538   LABSPEC 1.028 08/09/2018 1538   PHURINE 6.0 08/09/2018 1538   GLUCOSEU >=500 (A) 08/09/2018 1538   HGBUR NEGATIVE 08/09/2018  1538   BILIRUBINUR NEGATIVE 08/09/2018 1538   KETONESUR 80 (A) 08/09/2018 1538   PROTEINUR NEGATIVE 08/09/2018 1538   UROBILINOGEN 0.2 07/22/2014 1912   NITRITE NEGATIVE 08/09/2018 1538   LEUKOCYTESUR NEGATIVE 08/09/2018 1538   08/06/18  HCG ~61,000   Assessment/Plan: IUP @ 7 4/7 weeks Hyperemesis  Admit for IVF, IV antiemetics, bowel rest.   D5LR bolus and continuous IVF.  Zofran 4 mg q 8 hour, Phenergan 12.5 mg q 4 hour prn n/v.  Vit B6 IV.  NPO.  Advance to clear liquids tomorrow if pt does not vomit on HD#1.  Add on Reglan if no improvement.   Geryl Rankins 08/09/2018, 1:26 PM  Addendum-  11:23 PM   K 2.9.  Add 40 mEq to 500 ml LR.  Start po KDur when tolerating po.  Ketone 80.  Repeat urine dip in am.  Pt has not vomited since admission.  Advance to clear liquids then bland  diet.

## 2018-08-10 ENCOUNTER — Encounter (HOSPITAL_COMMUNITY): Payer: Self-pay | Admitting: *Deleted

## 2018-08-10 DIAGNOSIS — Z3A01 Less than 8 weeks gestation of pregnancy: Secondary | ICD-10-CM | POA: Diagnosis not present

## 2018-08-10 DIAGNOSIS — O210 Mild hyperemesis gravidarum: Secondary | ICD-10-CM | POA: Diagnosis not present

## 2018-08-10 DIAGNOSIS — E876 Hypokalemia: Secondary | ICD-10-CM | POA: Diagnosis not present

## 2018-08-10 LAB — URINALYSIS, DIPSTICK ONLY
Bilirubin Urine: NEGATIVE
Glucose, UA: NEGATIVE mg/dL
Hgb urine dipstick: NEGATIVE
Ketones, ur: 80 mg/dL — AB
Leukocytes, UA: NEGATIVE
NITRITE: NEGATIVE
Protein, ur: 30 mg/dL — AB
SPECIFIC GRAVITY, URINE: 1.028 (ref 1.005–1.030)
pH: 6 (ref 5.0–8.0)

## 2018-08-10 MED ORDER — LACTATED RINGERS IV SOLN
1000.0000 mL | Freq: Once | INTRAVENOUS | Status: AC
  Administered 2018-08-10: 1000 mL via INTRAVENOUS

## 2018-08-10 MED ORDER — FLEET ENEMA 7-19 GM/118ML RE ENEM: 1.0000 | ENEMA | Freq: Every day | RECTAL | Status: DC | PRN

## 2018-08-10 MED ORDER — POTASSIUM CHLORIDE CRYS ER 20 MEQ PO TBCR
40.0000 meq | EXTENDED_RELEASE_TABLET | Freq: Four times a day (QID) | ORAL | Status: DC
  Administered 2018-08-10 – 2018-08-11 (×2): 40 meq via ORAL
  Filled 2018-08-10 (×4): qty 2

## 2018-08-10 MED ORDER — POTASSIUM CHLORIDE 2 MEQ/ML IV SOLN
Freq: Once | INTRAVENOUS | Status: AC
  Administered 2018-08-10: via INTRAVENOUS
  Filled 2018-08-10: qty 1000

## 2018-08-10 MED ORDER — LACTATED RINGERS IV BOLUS
1000.0000 mL | Freq: Once | INTRAVENOUS | Status: AC
  Administered 2018-08-10: 1000 mL via INTRAVENOUS

## 2018-08-10 MED ORDER — VITAMIN B-6 50 MG PO TABS
50.0000 mg | ORAL_TABLET | Freq: Two times a day (BID) | ORAL | Status: DC
  Administered 2018-08-10 – 2018-08-11 (×2): 50 mg via ORAL
  Filled 2018-08-10 (×4): qty 1

## 2018-08-10 MED ORDER — ONDANSETRON 4 MG PO TBDP
4.0000 mg | ORAL_TABLET | Freq: Three times a day (TID) | ORAL | Status: DC
  Administered 2018-08-10 – 2018-08-11 (×2): 4 mg via ORAL
  Filled 2018-08-10 (×6): qty 1

## 2018-08-10 MED ORDER — SIMETHICONE 80 MG PO CHEW: 80.0000 mg | CHEWABLE_TABLET | Freq: Four times a day (QID) | ORAL | Status: DC | PRN

## 2018-08-10 MED ORDER — BISACODYL 10 MG RE SUPP
10.0000 mg | Freq: Every day | RECTAL | Status: DC | PRN
  Administered 2018-08-10: 10 mg via RECTAL
  Filled 2018-08-10: qty 1

## 2018-08-10 NOTE — Progress Notes (Signed)
Subjective: Patient reports no vomiting overnight.  Pt reports she started feeling gas or tightening in her abdomen over night.  She states she has not had a BM in a week.  Pt is not hungry, does not feel like eating but could try.    Objective: Temp:  [97.8 F (36.6 C)-98.7 F (37.1 C)] 98.7 F (37.1 C) (01/25 0738) Pulse Rate:  [68-77] 77 (01/25 0738) Resp:  [16-18] 16 (01/25 0738) BP: (93-121)/(54-72) 112/54 (01/25 0738) SpO2:  [96 %-100 %] 100 % (01/25 0738) Weight:  [88 kg] 88 kg (01/24 1300)    Gen:  NAD, resting quietly Abd:  Soft, nontender, nodistended Ext: No calf tenderness  Assessment/Plan: IUP @ 7 5/7 weeks Hyperemesis  No vomiting with IV Zofran 4 mg q8 hours and Vitamin B6 50 mg IV q 12.   Advance to clears when pt is ready to eat.  If tolerated, change to po medications. If tolerated, discharge home. Hypokalemia  40 meq of potassium added to fluids overnight.  Start Kdur 40 meq po q 6 hours when tolerating po. Constipation  Due to dehydration.  Dulcolax suppository now.  Fleets prn. DVT prophylaxis-TED hose. Anticipate discharge this evening.   LOS: 0 days    Geryl Rankins 08/10/2018, 8:36 AM

## 2018-08-10 NOTE — Progress Notes (Signed)
Patient tolerated bites of dry toast, yogurt, rice and mashed potatoes.  She had approximately 50cc emesis after eating.

## 2018-08-11 ENCOUNTER — Encounter (HOSPITAL_COMMUNITY): Payer: Self-pay | Admitting: Obstetrics and Gynecology

## 2018-08-11 DIAGNOSIS — E876 Hypokalemia: Secondary | ICD-10-CM | POA: Diagnosis not present

## 2018-08-11 DIAGNOSIS — Z3A01 Less than 8 weeks gestation of pregnancy: Secondary | ICD-10-CM | POA: Diagnosis not present

## 2018-08-11 DIAGNOSIS — O210 Mild hyperemesis gravidarum: Secondary | ICD-10-CM | POA: Diagnosis not present

## 2018-08-11 DIAGNOSIS — O09891 Supervision of other high risk pregnancies, first trimester: Secondary | ICD-10-CM

## 2018-08-11 MED ORDER — METOCLOPRAMIDE HCL 10 MG PO TABS: 10.0000 mg | ORAL_TABLET | Freq: Four times a day (QID) | ORAL | 0 refills | Status: DC | PRN

## 2018-08-11 MED ORDER — PYRIDOXINE HCL 50 MG PO TABS: 50.0000 mg | ORAL_TABLET | Freq: Two times a day (BID) | ORAL | 0 refills | Status: AC

## 2018-08-11 MED ORDER — METOCLOPRAMIDE HCL 10 MG PO TABS
10.0000 mg | ORAL_TABLET | Freq: Four times a day (QID) | ORAL | Status: DC
  Administered 2018-08-11: 10 mg via ORAL
  Filled 2018-08-11: qty 1

## 2018-08-11 MED ORDER — METOCLOPRAMIDE HCL 5 MG/5ML PO SOLN
10.0000 mg | Freq: Four times a day (QID) | ORAL | Status: DC
  Filled 2018-08-11 (×2): qty 10

## 2018-08-11 NOTE — Discharge Instructions (Signed)
Hyperemesis Gravidarum  Hyperemesis gravidarum is a severe form of nausea and vomiting that happens during pregnancy. Hyperemesis is worse than morning sickness. It may cause you to have nausea or vomiting all day for many days. It may keep you from eating and drinking enough food and liquids, which can lead to dehydration, malnutrition, and weight loss. Hyperemesis usually occurs during the first half (the first 20 weeks) of pregnancy. It often goes away once a woman is in her second half of pregnancy. However, sometimes hyperemesis continues through an entire pregnancy.  What are the causes?  The cause of this condition is not known. It may be related to changes in chemicals (hormones) in the body during pregnancy, such as the high level of pregnancy hormone (human chorionic gonadotropin) or the increase in the female sex hormone (estrogen).  What are the signs or symptoms?  Symptoms of this condition include:  Nausea that does not go away.  Vomiting that does not allow you to keep any food down.  Weight loss.  Body fluid loss (dehydration).  Having no desire to eat, or not liking food that you have previously enjoyed.  How is this diagnosed?  This condition may be diagnosed based on:  A physical exam.  Your medical history.  Your symptoms.  Blood tests.  Urine tests.  How is this treated?  This condition is managed by controlling symptoms. This may include:  Following an eating plan. This can help lessen nausea and vomiting.  Taking prescription medicines.  An eating plan and medicines are often used together to help control symptoms. If medicines do not help relieve nausea and vomiting, you may need to receive fluids through an IV at the hospital.  Follow these instructions at home:  Eating and drinking    Avoid the following:  Drinking fluids with meals. Try not to drink anything during the 30 minutes before and after your meals.  Drinking more than 1 cup of fluid at a time.  Eating foods that trigger your  symptoms. These may include spicy foods, coffee, high-fat foods, very sweet foods, and acidic foods.  Skipping meals. Nausea can be more intense on an empty stomach. If you cannot tolerate food, do not force it. Try sucking on ice chips or other frozen items and make up for missed calories later.  Lying down within 2 hours after eating.  Being exposed to environmental triggers. These may include food smells, smoky rooms, closed spaces, rooms with strong smells, warm or humid places, overly loud and noisy rooms, and rooms with motion or flickering lights. Try eating meals in a well-ventilated area that is free of strong smells.  Quick and sudden changes in your movement.  Taking iron pills and multivitamins that contain iron. If you take prescription iron pills, do not stop taking them unless your health care provider approves.  Preparing food. The smell of food can spoil your appetite or trigger nausea.  To help relieve your symptoms:  Listen to your body. Everyone is different and has different preferences. Find what works best for you.  Eat and drink slowly.  Eat 5-6 small meals daily instead of 3 large meals. Eating small meals and snacks can help you avoid an empty stomach.  In the morning, before getting out of bed, eat a couple of crackers to avoid moving around on an empty stomach.  Try eating starchy foods as these are usually tolerated well. Examples include cereal, toast, bread, potatoes, pasta, rice, and pretzels.  Include at   least 1 serving of protein with your meals and snacks. Protein options include lean meats, poultry, seafood, beans, nuts, nut butters, eggs, cheese, and yogurt.  Try eating a protein-rich snack before bed. Examples of a protein-rick snack include cheese and crackers or a peanut butter sandwich made with 1 slice of whole-wheat bread and 1 tsp (5 g) of peanut butter.  Eat or suck on things that have ginger in them. It may help relieve nausea. Add  tsp ground ginger to hot tea or  choose ginger tea.  Try drinking 100% fruit juice or an electrolyte drink. An electrolyte drink contains sodium, potassium, and chloride.  Drink fluids that are cold, clear, and carbonated or sour. Examples include lemonade, ginger ale, lemon-lime soda, ice water, and sparkling water.  Brush your teeth or use a mouth rinse after meals.  Talk with your health care provider about starting a supplement of vitamin B6.  General instructions  Take over-the-counter and prescription medicines only as told by your health care provider.  Follow instructions from your health care provider about eating or drinking restrictions.  Continue to take your prenatal vitamins as told by your health care provider. If you are having trouble taking your prenatal vitamins, talk with your health care provider about different options.  Keep all follow-up and pre-birth (prenatal) visits as told by your health care provider. This is important.  Contact a health care provider if:  You have pain in your abdomen.  You have a severe headache.  You have vision problems.  You are losing weight.  You feel weak or dizzy.  Get help right away if:  You cannot drink fluids without vomiting.  You vomit blood.  You have constant nausea and vomiting.  You are very weak.  You faint.  You have a fever and your symptoms suddenly get worse.  Summary  Hyperemesis gravidarum is a severe form of nausea and vomiting that happens during pregnancy.  Making some changes to your eating habits may help relieve nausea and vomiting.  This condition may be managed with medicine.  If medicines do not help relieve nausea and vomiting, you may need to receive fluids through an IV at the hospital.  This information is not intended to replace advice given to you by your health care provider. Make sure you discuss any questions you have with your health care provider.  Document Released: 07/03/2005 Document Revised: 07/23/2017 Document Reviewed: 03/01/2016  [employer] Interactive  Patient Education  2019 [employer]

## 2018-08-11 NOTE — Discharge Summary (Signed)
Physician Discharge Summary  Patient ID: Julie Carr MRN: [id number] DOB/AGE: March 04, 1981 37 y.o.  Admit date: 08/09/2018 Discharge date: 08/11/2018  Admission Diagnoses:  IUP @ 7 4/7 weeks Hyperemesis  Discharge Diagnoses:  Active Problems:   Hyperemesis gravidarum   Supervision of other high risk pregnancies, first trimester Hypokalemia  Discharged Condition: Improved  Hospital Course: Pt was NPO x 24 hours.  IVF bolus x 2 and continuous IVF (D5LR and LR with 40 meq potassium).  IV Zofran 4 mg q 8 scheduled and IV B6 50 mg BID was given.  Pt did not require Phenergan.  Pt advanced as tolerated.  Had small volume of emesis after eating.  Added Reglan 10 mg po Q 6.  Pt was on po Zofran and Vit B6, Reglan at time of discharge.  Pt had not had BM in 1+ week.  Given Dulcolax suppository and had BM X 3.  Consults: None  Significant Diagnostic Studies: labs:  CMP     Component Value Date/Time   NA 134 (L) 08/09/2018 1237   K 2.9 (L) 08/09/2018 1237   CL 98 08/09/2018 1237   CO2 25 08/09/2018 1237   GLUCOSE 100 (H) 08/09/2018 1237   BUN 7 08/09/2018 1237   CREATININE 0.71 08/09/2018 1237   CALCIUM 8.8 (L) 08/09/2018 1237   PROT 8.2 07/22/2014 1836   ALBUMIN 4.3 07/22/2014 1836   AST 23 07/22/2014 1836   ALT 16 07/22/2014 1836   ALKPHOS 55 07/22/2014 1836   BILITOT 0.7 07/22/2014 1836   GFRNONAA >60 08/09/2018 1237   GFRAA >60 08/09/2018 1237   Urinalysis    Component Value Date/Time   COLORURINE AMBER (A) 08/10/2018 1315   APPEARANCEUR CLOUDY (A) 08/10/2018 1315   LABSPEC 1.028 08/10/2018 1315   PHURINE 6.0 08/10/2018 1315   GLUCOSEU NEGATIVE 08/10/2018 1315   HGBUR NEGATIVE 08/10/2018 1315   BILIRUBINUR NEGATIVE 08/10/2018 1315   KETONESUR 80 (A) 08/10/2018 1315   PROTEINUR 30 (A) 08/10/2018 1315   UROBILINOGEN 0.2 07/22/2014 1912   NITRITE NEGATIVE 08/10/2018 1315   LEUKOCYTESUR NEGATIVE 08/10/2018 1315     Treatments: IV hydration and antiemetics as  above, Kdur po  Discharge Exam: Blood pressure 101/64, pulse 76, temperature 97.8 F (36.6 C), temperature source Oral, resp. rate 18, height 5\' 3"  (1.6 m), weight 88 kg, SpO2 100 %.  General:  NAD, comfortable Abd:  Soft, nontender, nondistended. Ext:  No calf tenderness, +TED hose.  Disposition: Discharge disposition: 01-Home or Self Care       Discharge Instructions    Discharge activity:   Complete by:  As directed    Rest as much as possible.   Discharge activity:   Complete by:  As directed    Rest as much as possible.   Discharge diet:   Complete by:  As directed    Bland diet.   Discharge diet:   Complete by:  As directed    Bland diet     Allergies as of 08/11/2018      Reactions   Promethazine Nausea And Vomiting   Promethazine by mouth      Medication List    TAKE these medications   metoCLOPramide 10 MG tablet Commonly known as:  REGLAN Take 1 tablet (10 mg total) by mouth every 6 (six) hours as needed for nausea.   ondansetron 4 MG disintegrating tablet Commonly known as:  ZOFRAN ODT 4mg  ODT q4 hours prn nausea/vomit   PRENATAL COMPLETE 14-0.4 MG Tabs Take 14 mg  by mouth daily.   pyridOXINE 50 MG tablet Commonly known as:  B-6 Take 1 tablet (50 mg total) by mouth 2 (two) times daily.      Follow-up Information    Geryl Rankins, MD. Schedule an appointment as soon as possible for a visit in 1 week(s).   Specialty:  Obstetrics and Gynecology Why:  Hospital follow up Contact information: [address] [place] [address] [phone]           Signed: Geryl Rankins 08/11/2018, 1:14 PM

## 2018-08-21 DIAGNOSIS — Z113 Encounter for screening for infections with a predominantly sexual mode of transmission: Secondary | ICD-10-CM | POA: Diagnosis not present

## 2018-08-21 DIAGNOSIS — Z3481 Encounter for supervision of other normal pregnancy, first trimester: Secondary | ICD-10-CM | POA: Diagnosis not present

## 2018-08-21 DIAGNOSIS — N898 Other specified noninflammatory disorders of vagina: Secondary | ICD-10-CM | POA: Diagnosis not present

## 2019-05-12 DIAGNOSIS — N898 Other specified noninflammatory disorders of vagina: Secondary | ICD-10-CM | POA: Diagnosis not present

## 2019-06-02 DIAGNOSIS — Z202 Contact with and (suspected) exposure to infections with a predominantly sexual mode of transmission: Secondary | ICD-10-CM | POA: Diagnosis not present

## 2019-06-02 DIAGNOSIS — Z113 Encounter for screening for infections with a predominantly sexual mode of transmission: Secondary | ICD-10-CM | POA: Diagnosis not present

## 2019-06-02 DIAGNOSIS — N898 Other specified noninflammatory disorders of vagina: Secondary | ICD-10-CM | POA: Diagnosis not present

## 2019-06-02 DIAGNOSIS — Z20828 Contact with and (suspected) exposure to other viral communicable diseases: Secondary | ICD-10-CM | POA: Diagnosis not present

## 2019-06-09 DIAGNOSIS — Z1231 Encounter for screening mammogram for malignant neoplasm of breast: Secondary | ICD-10-CM | POA: Diagnosis not present

## 2019-06-13 DIAGNOSIS — Z20828 Contact with and (suspected) exposure to other viral communicable diseases: Secondary | ICD-10-CM | POA: Diagnosis not present

## 2019-06-23 DIAGNOSIS — Z20828 Contact with and (suspected) exposure to other viral communicable diseases: Secondary | ICD-10-CM | POA: Diagnosis not present

## 2019-08-03 DIAGNOSIS — Z1159 Encounter for screening for other viral diseases: Secondary | ICD-10-CM | POA: Diagnosis not present

## 2019-08-03 DIAGNOSIS — F17210 Nicotine dependence, cigarettes, uncomplicated: Secondary | ICD-10-CM | POA: Diagnosis not present

## 2019-08-03 DIAGNOSIS — R5383 Other fatigue: Secondary | ICD-10-CM | POA: Diagnosis not present

## 2019-08-03 DIAGNOSIS — Z131 Encounter for screening for diabetes mellitus: Secondary | ICD-10-CM | POA: Diagnosis not present

## 2019-08-03 DIAGNOSIS — E559 Vitamin D deficiency, unspecified: Secondary | ICD-10-CM | POA: Diagnosis not present

## 2019-08-03 DIAGNOSIS — Z79899 Other long term (current) drug therapy: Secondary | ICD-10-CM | POA: Diagnosis not present

## 2019-08-03 DIAGNOSIS — Z03818 Encounter for observation for suspected exposure to other biological agents ruled out: Secondary | ICD-10-CM | POA: Diagnosis not present

## 2019-08-03 DIAGNOSIS — Z3200 Encounter for pregnancy test, result unknown: Secondary | ICD-10-CM | POA: Diagnosis not present

## 2019-08-03 DIAGNOSIS — R0602 Shortness of breath: Secondary | ICD-10-CM | POA: Diagnosis not present

## 2019-08-03 DIAGNOSIS — E7800 Pure hypercholesterolemia, unspecified: Secondary | ICD-10-CM | POA: Diagnosis not present

## 2019-09-03 DIAGNOSIS — Z113 Encounter for screening for infections with a predominantly sexual mode of transmission: Secondary | ICD-10-CM | POA: Diagnosis not present

## 2019-09-03 DIAGNOSIS — N898 Other specified noninflammatory disorders of vagina: Secondary | ICD-10-CM | POA: Diagnosis not present

## 2019-09-15 DIAGNOSIS — E7800 Pure hypercholesterolemia, unspecified: Secondary | ICD-10-CM | POA: Diagnosis not present

## 2019-09-15 DIAGNOSIS — E538 Deficiency of other specified B group vitamins: Secondary | ICD-10-CM | POA: Diagnosis not present

## 2019-09-15 DIAGNOSIS — Z79899 Other long term (current) drug therapy: Secondary | ICD-10-CM | POA: Diagnosis not present

## 2019-09-15 DIAGNOSIS — E559 Vitamin D deficiency, unspecified: Secondary | ICD-10-CM | POA: Diagnosis not present

## 2019-10-01 DIAGNOSIS — G43909 Migraine, unspecified, not intractable, without status migrainosus: Secondary | ICD-10-CM | POA: Diagnosis not present

## 2019-10-01 DIAGNOSIS — N920 Excessive and frequent menstruation with regular cycle: Secondary | ICD-10-CM | POA: Diagnosis not present

## 2019-10-01 DIAGNOSIS — Z309 Encounter for contraceptive management, unspecified: Secondary | ICD-10-CM | POA: Diagnosis not present

## 2019-10-01 DIAGNOSIS — Z3201 Encounter for pregnancy test, result positive: Secondary | ICD-10-CM | POA: Diagnosis not present

## 2019-10-01 DIAGNOSIS — Z01419 Encounter for gynecological examination (general) (routine) without abnormal findings: Secondary | ICD-10-CM | POA: Diagnosis not present

## 2019-10-07 DIAGNOSIS — N920 Excessive and frequent menstruation with regular cycle: Secondary | ICD-10-CM | POA: Diagnosis not present

## 2019-10-07 DIAGNOSIS — O209 Hemorrhage in early pregnancy, unspecified: Secondary | ICD-10-CM | POA: Diagnosis not present

## 2019-10-14 ENCOUNTER — Other Ambulatory Visit: Payer: Self-pay

## 2019-10-14 ENCOUNTER — Encounter (HOSPITAL_COMMUNITY): Payer: Self-pay | Admitting: Obstetrics and Gynecology

## 2019-10-14 ENCOUNTER — Inpatient Hospital Stay (HOSPITAL_COMMUNITY)
Admission: AD | Admit: 2019-10-14 | Discharge: 2019-10-14 | Disposition: A | Discharge status: Home / Self Care | Payer: BC Managed Care – PPO | Attending: Obstetrics & Gynecology | Admitting: Obstetrics & Gynecology

## 2019-10-14 DIAGNOSIS — O210 Mild hyperemesis gravidarum: Secondary | ICD-10-CM

## 2019-10-14 DIAGNOSIS — O09521 Supervision of elderly multigravida, first trimester: Secondary | ICD-10-CM | POA: Insufficient documentation

## 2019-10-14 DIAGNOSIS — O209 Hemorrhage in early pregnancy, unspecified: Secondary | ICD-10-CM | POA: Diagnosis not present

## 2019-10-14 DIAGNOSIS — Z3A08 8 weeks gestation of pregnancy: Secondary | ICD-10-CM | POA: Diagnosis not present

## 2019-10-14 DIAGNOSIS — O219 Vomiting of pregnancy, unspecified: Secondary | ICD-10-CM

## 2019-10-14 LAB — URINALYSIS, ROUTINE W REFLEX MICROSCOPIC
Glucose, UA: NEGATIVE mg/dL
Hgb urine dipstick: NEGATIVE
Ketones, ur: 20 mg/dL — AB
Nitrite: NEGATIVE
Protein, ur: 100 mg/dL — AB
Specific Gravity, Urine: 1.033 — ABNORMAL HIGH (ref 1.005–1.030)
Squamous Epithelial / HPF: >50 — ABNORMAL HIGH (ref 0–5)
pH: 5 (ref 5.0–8.0)

## 2019-10-14 MED ORDER — METOCLOPRAMIDE HCL 10 MG PO TABS: 10.0000 mg | ORAL_TABLET | Freq: Three times a day (TID) | ORAL | 0 refills | Status: AC | PRN

## 2019-10-14 MED ORDER — SODIUM CHLORIDE 0.9 % IV SOLN
8.0000 mg | Freq: Once | INTRAVENOUS | Status: AC
  Administered 2019-10-14: 18:00:00 8 mg via INTRAVENOUS
  Filled 2019-10-14: qty 4

## 2019-10-14 MED ORDER — METOCLOPRAMIDE HCL 5 MG/ML IJ SOLN
10.0000 mg | Freq: Once | INTRAMUSCULAR | Status: AC
  Administered 2019-10-14: 19:00:00 10 mg via INTRAVENOUS
  Filled 2019-10-14: qty 2

## 2019-10-14 MED ORDER — LACTATED RINGERS IV BOLUS
1000.0000 mL | Freq: Once | INTRAVENOUS | Status: AC
  Administered 2019-10-14: 1000 mL via INTRAVENOUS

## 2019-10-14 MED ORDER — LACTATED RINGERS IV BOLUS
1000.0000 mL | Freq: Once | INTRAVENOUS | Status: AC
  Administered 2019-10-14: 17:00:00 1000 mL via INTRAVENOUS

## 2019-10-14 MED ORDER — FAMOTIDINE IN NACL 20-0.9 MG/50ML-% IV SOLN
20.0000 mg | Freq: Once | INTRAVENOUS | Status: AC
  Administered 2019-10-14: 20 mg via INTRAVENOUS
  Filled 2019-10-14: qty 50

## 2019-10-14 MED ORDER — FAMOTIDINE 20 MG PO TABS: 20.0000 mg | ORAL_TABLET | Freq: Every day | ORAL | 0 refills | Status: DC

## 2019-10-14 NOTE — MAU Provider Note (Signed)
Chief Complaint: Emesis   First Provider Initiated Contact with Patient 10/14/19 1707     SUBJECTIVE HPI: Julie Carr is a 39 y.o. [id number] at [redacted]w[redacted]d who presents to Maternity Admissions reporting n/v. Sent from the office [facility] ob/gyn) for IV fluids. States she has vomited countless times today. Does not have antiemetic at home. Also endorses heartburn. Denies abdominal pain, or vaginal bleeding.  Was prescribed zofran today by Dr. Nelda Marseille. Hasn't picked it up yet b/c she was instructed to come here.    History reviewed. No pertinent past medical history. OB History  Gravida Para Term Preterm AB Living  6 1 1  0 4 1  SAB TAB Ectopic Multiple Live Births  1 3 0 0 1    # Outcome Date GA Lbr Len/2nd Weight Sex Delivery Anes PTL Lv  6 Current           5 Term 12/28/05 [redacted]w[redacted]d   M Vag-Spont EPI  LIV  4 TAB           3 TAB           2 TAB           1 SAB            Past Surgical History:  Procedure Laterality Date  . NO PAST SURGERIES     Social History   Socioeconomic History  . Marital status: Single    Spouse name: Not on file  . Number of children: Not on file  . Years of education: Not on file  . Highest education level: Not on file  Occupational History  . Not on file  Tobacco Use  . Smoking status: Never Smoker  . Smokeless tobacco: Never Used  Substance and Sexual Activity  . Alcohol use: Not on file  . Drug use: No  . Sexual activity: Yes  Other Topics Concern  . Not on file  Social History Narrative  . Not on file   Social Determinants of Health   Financial Resource Strain:   . Difficulty of Paying Living Expenses:   Food Insecurity:   . Worried About Charity fundraiser in the Last Year:   . Arboriculturist in the Last Year:   Transportation Needs:   . Film/video editor (Medical):   Marland Kitchen Lack of Transportation (Non-Medical):   Physical Activity:   . Days of Exercise per Week:   . Minutes of Exercise per Session:   Stress:   . Feeling of Stress  :   Social Connections:   . Frequency of Communication with Friends and Family:   . Frequency of Social Gatherings with Friends and Family:   . Attends Religious Services:   . Active Member of Clubs or Organizations:   . Attends Archivist Meetings:   Marland Kitchen Marital Status:   Intimate Partner Violence:   . Fear of Current or Ex-Partner:   . Emotionally Abused:   Marland Kitchen Physically Abused:   . Sexually Abused:    No family history on file. No current facility-administered medications on file prior to encounter.   Current Outpatient Medications on File Prior to Encounter  Medication Sig Dispense Refill  . doxylamine, Sleep, (UNISOM) 25 MG tablet Take 25 mg by mouth at bedtime as needed.    . Prenatal Vit-Fe Fumarate-FA (PRENATAL COMPLETE) 14-0.4 MG TABS Take 14 mg by mouth daily. 60 each 0  . pyridOXINE (B-6) 50 MG tablet Take 1 tablet (50 mg total) by mouth 2 (  two) times daily. 60 tablet 0  . metoCLOPramide (REGLAN) 10 MG tablet Take 1 tablet (10 mg total) by mouth every 6 (six) hours as needed for nausea. 30 tablet 0  . ondansetron (ZOFRAN ODT) 4 MG disintegrating tablet 4mg  ODT q4 hours prn nausea/vomit (Patient not taking: Reported on 08/09/2018) 15 tablet 0   Allergies  Allergen Reactions  . Promethazine Nausea And Vomiting    Promethazine by mouth    I have reviewed patient's Past Medical Hx, Surgical Hx, Family Hx, Social Hx, medications and allergies.   Review of Systems  Constitutional: Negative.   Gastrointestinal: Positive for nausea and vomiting. Negative for abdominal pain, constipation and diarrhea.  Genitourinary: Negative.     OBJECTIVE Patient Vitals for the past 24 hrs:  BP Temp Temp src Pulse Resp SpO2 Height Weight  10/14/19 1639 109/84 98.6 F (37 C) Oral 96 16 98 % 5\' 2"  (1.575 m) 84.2 kg   Constitutional: Well-developed, well-nourished female in no acute distress.  Cardiovascular: normal rate & rhythm, no murmur Respiratory: normal rate and effort.  Lung sounds clear throughout GI: Abd soft, non-tender, Pos BS x 4. No guarding or rebound tenderness MS: Extremities nontender, no edema, normal ROM Neurologic: Alert and oriented x 4.    LAB RESULTS Results for orders placed or performed during the hospital encounter of 10/14/19 (from the past 24 hour(s))  Urinalysis, Routine w reflex microscopic     Status: Abnormal   Collection Time: 10/14/19  4:55 PM  Result Value Ref Range   Color, Urine AMBER (A) YELLOW   APPearance CLOUDY (A) CLEAR   Specific Gravity, Urine 1.033 (H) 1.005 - 1.030   pH 5.0 5.0 - 8.0   Glucose, UA NEGATIVE NEGATIVE mg/dL   Hgb urine dipstick NEGATIVE NEGATIVE   Bilirubin Urine SMALL (A) NEGATIVE   Ketones, ur 20 (A) NEGATIVE mg/dL   Protein, ur 10/16/19 (A) NEGATIVE mg/dL   Nitrite NEGATIVE NEGATIVE   Leukocytes,Ua SMALL (A) NEGATIVE   RBC / HPF 0-5 0 - 5 RBC/hpf   WBC, UA 11-20 0 - 5 WBC/hpf   Bacteria, UA MANY (A) NONE SEEN   Squamous Epithelial / LPF >50 (H) 0 - 5   Mucus PRESENT     IMAGING No results found.  MAU COURSE Orders Placed This Encounter  Procedures  . Urinalysis, Routine w reflex microscopic  . Discharge patient   Meds ordered this encounter  Medications  . lactated ringers bolus 1,000 mL  . famotidine (PEPCID) IVPB 20 mg premix  . ondansetron (ZOFRAN) 8 mg in sodium chloride 0.9 % 50 mL IVPB  . lactated ringers bolus 1,000 mL  . metoCLOPramide (REGLAN) injection 10 mg  . metoCLOPramide (REGLAN) 10 MG tablet    Sig: Take 1 tablet (10 mg total) by mouth every 8 (eight) hours as needed for nausea.    Dispense:  30 tablet    Refill:  0    Order Specific Question:   Supervising Provider    Answer:   10/16/19 [[id number]]  . famotidine (PEPCID) 20 MG tablet    Sig: Take 1 tablet (20 mg total) by mouth daily.    Dispense:  30 tablet    Refill:  0    Order Specific Question:   Supervising Provider    Answer:   [id number]    MDM Patient denies abdominal pain or vaginal  bleeding. Per Dr. Reva Bores, patient has viable IUP on ultrasound in office today.   Offered  patient IV fluids, zofran, & pepcid.  Pt vomited after meds. Given additional liter of fluids & reglan 10 mg IV.   Patient would like rx reglan & pepcid  ASSESSMENT 1. Nausea and vomiting during pregnancy prior to [redacted] weeks gestation   2. [redacted] weeks gestation of pregnancy   3. Hyperemesis gravidarum     PLAN Discharge home in stable condition. Rx reglan Rx pepcid Discussed reasons to return to MAU   Follow-up Information    Myna Hidalgo, DO Follow up.   Specialty: Obstetrics and Gynecology Contact information: [address] [place] [address] [phone]          Allergies as of 10/14/2019      Reactions   Promethazine Nausea And Vomiting   Promethazine by mouth      Medication List    TAKE these medications   doxylamine (Sleep) 25 MG tablet Commonly known as: UNISOM Take 25 mg by mouth at bedtime as needed.   famotidine 20 MG tablet Commonly known as: Pepcid Take 1 tablet (20 mg total) by mouth daily.   metoCLOPramide 10 MG tablet Commonly known as: REGLAN Take 1 tablet (10 mg total) by mouth every 8 (eight) hours as needed for nausea. What changed: when to take this   ondansetron 4 MG disintegrating tablet Commonly known as: Zofran ODT 4mg  ODT q4 hours prn nausea/vomit   Prenatal Complete 14-0.4 MG Tabs Take 14 mg by mouth daily.   pyridOXINE 50 MG tablet Commonly known as: B-6 Take 1 tablet (50 mg total) by mouth 2 (two) times daily.        , NP 10/14/2019  8:10 PM

## 2019-10-14 NOTE — MAU Note (Addendum)
Pt tolerated sips of sprite and crackers.

## 2019-10-14 NOTE — Discharge Instructions (Signed)

## 2019-10-14 NOTE — MAU Note (Signed)
Crackers and sprite given to pt for PO challenge

## 2019-10-14 NOTE — MAU Note (Signed)
Can't eat, can't drink, in the early stages of pregnancy.  Ongoing vomiting.

## 2019-10-29 DIAGNOSIS — Z3A11 11 weeks gestation of pregnancy: Secondary | ICD-10-CM | POA: Diagnosis not present

## 2019-10-29 DIAGNOSIS — Z3481 Encounter for supervision of other normal pregnancy, first trimester: Secondary | ICD-10-CM | POA: Diagnosis not present

## 2019-10-29 DIAGNOSIS — O09521 Supervision of elderly multigravida, first trimester: Secondary | ICD-10-CM | POA: Diagnosis not present

## 2019-11-06 ENCOUNTER — Other Ambulatory Visit: Payer: Self-pay | Admitting: Student

## 2019-11-07 DIAGNOSIS — O09521 Supervision of elderly multigravida, first trimester: Secondary | ICD-10-CM | POA: Diagnosis not present

## 2019-11-21 ENCOUNTER — Other Ambulatory Visit: Payer: Self-pay | Admitting: Obstetrics & Gynecology

## 2019-11-21 DIAGNOSIS — O285 Abnormal chromosomal and genetic finding on antenatal screening of mother: Secondary | ICD-10-CM

## 2019-11-25 ENCOUNTER — Encounter: Payer: Self-pay | Admitting: Neurology

## 2019-11-25 ENCOUNTER — Ambulatory Visit: Payer: Self-pay | Admitting: Neurology

## 2019-11-25 NOTE — Progress Notes (Deleted)
[facility]    Provider:  Dr Jaynee Eagles Requesting Provider: Thongteum, Maryjo Rochester, NP Primary Care Provider:  System, Provider Not In  CC:  ***  HPI:  Julie Carr is a 39 y.o. female here as requested by Arlyce Harman, NP for headache. PMHx migraine, abnormal menses.   Reviewed notes, labs and imaging from outside physicians, which showed ***  10/01/2019: HCG Urine positive   Review of Systems: Patient complains of symptoms per HPI as well as the following symptoms ***. Pertinent negatives and positives per HPI. All others negative.   Social History   Socioeconomic History  . Marital status: Single    Spouse name: Not on file  . Number of children: Not on file  . Years of education: Not on file  . Highest education level: Not on file  Occupational History  . Not on file  Tobacco Use  . Smoking status: Never Smoker  . Smokeless tobacco: Never Used  Substance and Sexual Activity  . Alcohol use: Not on file  . Drug use: No  . Sexual activity: Yes  Other Topics Concern  . Not on file  Social History Narrative  . Not on file   Social Determinants of Health   Financial Resource Strain:   . Difficulty of Paying Living Expenses:   Food Insecurity:   . Worried About Charity fundraiser in the Last Year:   . Arboriculturist in the Last Year:   Transportation Needs:   . Film/video editor (Medical):   Marland Kitchen Lack of Transportation (Non-Medical):   Physical Activity:   . Days of Exercise per Week:   . Minutes of Exercise per Session:   Stress:   . Feeling of Stress :   Social Connections:   . Frequency of Communication with Friends and Family:   . Frequency of Social Gatherings with Friends and Family:   . Attends Religious Services:   . Active Member of Clubs or Organizations:   . Attends Archivist Meetings:   Marland Kitchen Marital Status:   Intimate Partner Violence:   . Fear of Current or Ex-Partner:   . Emotionally Abused:   Marland Kitchen  Physically Abused:   . Sexually Abused:     No family history on file.  No past medical history on file.  Patient Active Problem List   Diagnosis Date Noted  . Supervision of other high risk pregnancies, first trimester 08/11/2018  . Hyperemesis gravidarum 08/09/2018    Past Surgical History:  Procedure Laterality Date  . NO PAST SURGERIES      Current Outpatient Medications  Medication Sig Dispense Refill  . doxylamine, Sleep, (UNISOM) 25 MG tablet Take 25 mg by mouth at bedtime as needed.    . famotidine (PEPCID) 20 MG tablet TAKE 1 TABLET BY MOUTH EVERY DAY 30 tablet 0  . metoCLOPramide (REGLAN) 10 MG tablet Take 1 tablet (10 mg total) by mouth every 8 (eight) hours as needed for nausea. 30 tablet 0  . ondansetron (ZOFRAN ODT) 4 MG disintegrating tablet 4mg  ODT q4 hours prn nausea/vomit (Patient not taking: Reported on 08/09/2018) 15 tablet 0  . Prenatal Vit-Fe Fumarate-FA (PRENATAL COMPLETE) 14-0.4 MG TABS Take 14 mg by mouth daily. 60 each 0  . pyridOXINE (B-6) 50 MG tablet Take 1 tablet (50 mg total) by mouth 2 (two) times daily. 60 tablet 0   No current facility-administered medications for this visit.    Allergies as of 11/25/2019 - Review Complete 10/14/2019  Allergen Reaction Noted  . Promethazine Nausea And Vomiting 08/09/2018    Vitals: There were no vitals taken for this visit. Last Weight:  Wt Readings from Last 1 Encounters:  10/14/19 185 lb 11.2 oz (84.2 kg)   Last Height:   Ht Readings from Last 1 Encounters:  10/14/19 5\' 2"  (1.575 m)     Physical exam: Exam: Gen: NAD, conversant, well nourised, obese, well groomed                     CV: RRR, no MRG. No Carotid Bruits. No peripheral edema, warm, nontender Eyes: Conjunctivae clear without exudates or hemorrhage  Neuro: Detailed Neurologic Exam  Speech:    Speech is normal; fluent and spontaneous with normal comprehension.  Cognition:    The patient is oriented to person, place, and time;      recent and remote memory intact;     language fluent;     normal attention, concentration,     fund of knowledge Cranial Nerves:    The pupils are equal, round, and reactive to light. The fundi are normal and spontaneous venous pulsations are present. Visual fields are full to finger confrontation. Extraocular movements are intact. Trigeminal sensation is intact and the muscles of mastication are normal. The face is symmetric. The palate elevates in the midline. Hearing intact. Voice is normal. Shoulder shrug is normal. The tongue has normal motion without fasciculations.   Coordination:    Normal finger to nose and heel to shin. Normal rapid alternating movements.   Gait:    Heel-toe and tandem gait are normal.   Motor Observation:    No asymmetry, no atrophy, and no involuntary movements noted. Tone:    Normal muscle tone.    Posture:    Posture is normal. normal erect    Strength:    Strength is V/V in the upper and lower limbs.      Sensation: intact to LT     Reflex Exam:  DTR's:    Deep tendon reflexes in the upper and lower extremities are normal bilaterally.   Toes:    The toes are downgoing bilaterally.   Clonus:    Clonus is absent.    Assessment/Plan:    No orders of the defined types were placed in this encounter.  No orders of the defined types were placed in this encounter.   Cc: Thongteum, , NP,  System, Provider Not In  Dorma Russell, MD  [facility] [address] [place], [place]  Phone [phone] Fax [phone]

## 2019-12-01 ENCOUNTER — Other Ambulatory Visit: Payer: BC Managed Care – PPO

## 2019-12-01 ENCOUNTER — Ambulatory Visit: Payer: BC Managed Care – PPO

## 2019-12-23 ENCOUNTER — Ambulatory Visit: Payer: BC Managed Care – PPO

## 2020-01-06 DIAGNOSIS — E782 Mixed hyperlipidemia: Secondary | ICD-10-CM | POA: Diagnosis not present

## 2020-01-06 DIAGNOSIS — E538 Deficiency of other specified B group vitamins: Secondary | ICD-10-CM | POA: Diagnosis not present

## 2020-01-06 DIAGNOSIS — Z79899 Other long term (current) drug therapy: Secondary | ICD-10-CM | POA: Diagnosis not present

## 2020-01-06 DIAGNOSIS — E559 Vitamin D deficiency, unspecified: Secondary | ICD-10-CM | POA: Diagnosis not present

## 2020-02-03 DIAGNOSIS — Z03818 Encounter for observation for suspected exposure to other biological agents ruled out: Secondary | ICD-10-CM | POA: Diagnosis not present

## 2020-02-03 DIAGNOSIS — Z20822 Contact with and (suspected) exposure to covid-19: Secondary | ICD-10-CM | POA: Diagnosis not present

## 2020-02-13 DIAGNOSIS — Z03818 Encounter for observation for suspected exposure to other biological agents ruled out: Secondary | ICD-10-CM | POA: Diagnosis not present

## 2020-02-13 DIAGNOSIS — Z20822 Contact with and (suspected) exposure to covid-19: Secondary | ICD-10-CM | POA: Diagnosis not present

## 2020-03-03 DIAGNOSIS — Z20822 Contact with and (suspected) exposure to covid-19: Secondary | ICD-10-CM | POA: Diagnosis not present

## 2020-05-20 DIAGNOSIS — E782 Mixed hyperlipidemia: Secondary | ICD-10-CM | POA: Diagnosis not present

## 2020-05-20 DIAGNOSIS — Z131 Encounter for screening for diabetes mellitus: Secondary | ICD-10-CM | POA: Diagnosis not present

## 2020-05-20 DIAGNOSIS — E559 Vitamin D deficiency, unspecified: Secondary | ICD-10-CM | POA: Diagnosis not present

## 2020-05-20 DIAGNOSIS — Z79899 Other long term (current) drug therapy: Secondary | ICD-10-CM | POA: Diagnosis not present

## 2020-05-20 DIAGNOSIS — E538 Deficiency of other specified B group vitamins: Secondary | ICD-10-CM | POA: Diagnosis not present

## 2020-05-20 DIAGNOSIS — Z3200 Encounter for pregnancy test, result unknown: Secondary | ICD-10-CM | POA: Diagnosis not present

## 2020-05-20 DIAGNOSIS — E669 Obesity, unspecified: Secondary | ICD-10-CM | POA: Diagnosis not present

## 2020-05-20 DIAGNOSIS — Z6835 Body mass index (BMI) 35.0-35.9, adult: Secondary | ICD-10-CM | POA: Diagnosis not present

## 2020-06-26 DIAGNOSIS — E538 Deficiency of other specified B group vitamins: Secondary | ICD-10-CM | POA: Diagnosis not present

## 2020-06-26 DIAGNOSIS — Z6835 Body mass index (BMI) 35.0-35.9, adult: Secondary | ICD-10-CM | POA: Diagnosis not present

## 2020-06-26 DIAGNOSIS — E782 Mixed hyperlipidemia: Secondary | ICD-10-CM | POA: Diagnosis not present

## 2020-06-26 DIAGNOSIS — G43909 Migraine, unspecified, not intractable, without status migrainosus: Secondary | ICD-10-CM | POA: Diagnosis not present

## 2020-06-26 DIAGNOSIS — Z3200 Encounter for pregnancy test, result unknown: Secondary | ICD-10-CM | POA: Diagnosis not present

## 2020-06-26 DIAGNOSIS — Z79899 Other long term (current) drug therapy: Secondary | ICD-10-CM | POA: Diagnosis not present

## 2020-06-26 DIAGNOSIS — E669 Obesity, unspecified: Secondary | ICD-10-CM | POA: Diagnosis not present

## 2020-11-06 DIAGNOSIS — S8012XA Contusion of left lower leg, initial encounter: Secondary | ICD-10-CM | POA: Diagnosis not present

## 2020-11-06 DIAGNOSIS — Z041 Encounter for examination and observation following transport accident: Secondary | ICD-10-CM | POA: Diagnosis not present

## 2020-11-06 DIAGNOSIS — S8991XA Unspecified injury of right lower leg, initial encounter: Secondary | ICD-10-CM | POA: Diagnosis not present

## 2020-11-06 DIAGNOSIS — R0789 Other chest pain: Secondary | ICD-10-CM | POA: Diagnosis not present

## 2020-11-06 DIAGNOSIS — S161XXA Strain of muscle, fascia and tendon at neck level, initial encounter: Secondary | ICD-10-CM | POA: Diagnosis not present

## 2020-11-06 DIAGNOSIS — M542 Cervicalgia: Secondary | ICD-10-CM | POA: Diagnosis not present

## 2020-11-06 DIAGNOSIS — R079 Chest pain, unspecified: Secondary | ICD-10-CM | POA: Diagnosis not present

## 2020-12-21 DIAGNOSIS — S8001XA Contusion of right knee, initial encounter: Secondary | ICD-10-CM | POA: Diagnosis not present

## 2020-12-21 DIAGNOSIS — M7582 Other shoulder lesions, left shoulder: Secondary | ICD-10-CM | POA: Diagnosis not present

## 2020-12-21 DIAGNOSIS — S335XXA Sprain of ligaments of lumbar spine, initial encounter: Secondary | ICD-10-CM | POA: Diagnosis not present

## 2020-12-21 DIAGNOSIS — M542 Cervicalgia: Secondary | ICD-10-CM | POA: Diagnosis not present

## 2021-01-04 DIAGNOSIS — S8001XA Contusion of right knee, initial encounter: Secondary | ICD-10-CM | POA: Diagnosis not present

## 2021-01-04 DIAGNOSIS — S335XXA Sprain of ligaments of lumbar spine, initial encounter: Secondary | ICD-10-CM | POA: Diagnosis not present

## 2021-01-04 DIAGNOSIS — M542 Cervicalgia: Secondary | ICD-10-CM | POA: Diagnosis not present

## 2021-01-04 DIAGNOSIS — M7582 Other shoulder lesions, left shoulder: Secondary | ICD-10-CM | POA: Diagnosis not present

## 2021-01-18 DIAGNOSIS — F4322 Adjustment disorder with anxiety: Secondary | ICD-10-CM | POA: Diagnosis not present

## 2021-01-26 DIAGNOSIS — F411 Generalized anxiety disorder: Secondary | ICD-10-CM | POA: Diagnosis not present

## 2021-01-28 DIAGNOSIS — N898 Other specified noninflammatory disorders of vagina: Secondary | ICD-10-CM | POA: Diagnosis not present

## 2021-01-28 DIAGNOSIS — R109 Unspecified abdominal pain: Secondary | ICD-10-CM | POA: Diagnosis not present

## 2021-01-28 DIAGNOSIS — Z113 Encounter for screening for infections with a predominantly sexual mode of transmission: Secondary | ICD-10-CM | POA: Diagnosis not present

## 2021-01-28 DIAGNOSIS — N926 Irregular menstruation, unspecified: Secondary | ICD-10-CM | POA: Diagnosis not present

## 2021-02-01 DIAGNOSIS — S335XXA Sprain of ligaments of lumbar spine, initial encounter: Secondary | ICD-10-CM | POA: Diagnosis not present

## 2021-02-01 DIAGNOSIS — M7582 Other shoulder lesions, left shoulder: Secondary | ICD-10-CM | POA: Diagnosis not present

## 2021-02-01 DIAGNOSIS — S8001XA Contusion of right knee, initial encounter: Secondary | ICD-10-CM | POA: Diagnosis not present

## 2021-02-01 DIAGNOSIS — M542 Cervicalgia: Secondary | ICD-10-CM | POA: Diagnosis not present

## 2021-02-02 DIAGNOSIS — F411 Generalized anxiety disorder: Secondary | ICD-10-CM | POA: Diagnosis not present

## 2021-02-09 DIAGNOSIS — N926 Irregular menstruation, unspecified: Secondary | ICD-10-CM | POA: Diagnosis not present

## 2021-02-09 DIAGNOSIS — D251 Intramural leiomyoma of uterus: Secondary | ICD-10-CM | POA: Diagnosis not present

## 2021-02-09 DIAGNOSIS — D252 Subserosal leiomyoma of uterus: Secondary | ICD-10-CM | POA: Diagnosis not present

## 2021-02-09 DIAGNOSIS — F411 Generalized anxiety disorder: Secondary | ICD-10-CM | POA: Diagnosis not present

## 2021-02-15 DIAGNOSIS — F321 Major depressive disorder, single episode, moderate: Secondary | ICD-10-CM | POA: Diagnosis not present

## 2021-02-16 DIAGNOSIS — F321 Major depressive disorder, single episode, moderate: Secondary | ICD-10-CM | POA: Diagnosis not present

## 2021-02-17 DIAGNOSIS — F411 Generalized anxiety disorder: Secondary | ICD-10-CM | POA: Diagnosis not present

## 2021-02-23 DIAGNOSIS — F321 Major depressive disorder, single episode, moderate: Secondary | ICD-10-CM | POA: Diagnosis not present

## 2021-03-07 DIAGNOSIS — Z3201 Encounter for pregnancy test, result positive: Secondary | ICD-10-CM | POA: Diagnosis not present

## 2021-03-07 DIAGNOSIS — Z124 Encounter for screening for malignant neoplasm of cervix: Secondary | ICD-10-CM | POA: Diagnosis not present

## 2021-03-07 DIAGNOSIS — Z01419 Encounter for gynecological examination (general) (routine) without abnormal findings: Secondary | ICD-10-CM | POA: Diagnosis not present

## 2021-03-07 DIAGNOSIS — N921 Excessive and frequent menstruation with irregular cycle: Secondary | ICD-10-CM | POA: Diagnosis not present

## 2021-03-09 DIAGNOSIS — O209 Hemorrhage in early pregnancy, unspecified: Secondary | ICD-10-CM | POA: Diagnosis not present

## 2021-03-09 DIAGNOSIS — F321 Major depressive disorder, single episode, moderate: Secondary | ICD-10-CM | POA: Diagnosis not present

## 2021-03-11 DIAGNOSIS — O209 Hemorrhage in early pregnancy, unspecified: Secondary | ICD-10-CM | POA: Diagnosis not present

## 2021-03-15 DIAGNOSIS — S335XXA Sprain of ligaments of lumbar spine, initial encounter: Secondary | ICD-10-CM | POA: Diagnosis not present

## 2021-03-15 DIAGNOSIS — M542 Cervicalgia: Secondary | ICD-10-CM | POA: Diagnosis not present

## 2021-03-15 DIAGNOSIS — S8001XA Contusion of right knee, initial encounter: Secondary | ICD-10-CM | POA: Diagnosis not present

## 2021-03-15 DIAGNOSIS — M7582 Other shoulder lesions, left shoulder: Secondary | ICD-10-CM | POA: Diagnosis not present

## 2021-03-16 DIAGNOSIS — F321 Major depressive disorder, single episode, moderate: Secondary | ICD-10-CM | POA: Diagnosis not present

## 2021-03-18 DIAGNOSIS — O039 Complete or unspecified spontaneous abortion without complication: Secondary | ICD-10-CM | POA: Diagnosis not present

## 2021-03-18 DIAGNOSIS — M79603 Pain in arm, unspecified: Secondary | ICD-10-CM | POA: Diagnosis not present

## 2021-03-18 DIAGNOSIS — F411 Generalized anxiety disorder: Secondary | ICD-10-CM | POA: Diagnosis not present

## 2021-03-24 DIAGNOSIS — F411 Generalized anxiety disorder: Secondary | ICD-10-CM | POA: Diagnosis not present

## 2021-03-24 DIAGNOSIS — F4311 Post-traumatic stress disorder, acute: Secondary | ICD-10-CM | POA: Diagnosis not present

## 2021-03-24 DIAGNOSIS — Z0289 Encounter for other administrative examinations: Secondary | ICD-10-CM | POA: Diagnosis not present

## 2021-03-30 DIAGNOSIS — F321 Major depressive disorder, single episode, moderate: Secondary | ICD-10-CM | POA: Diagnosis not present

## 2021-04-06 DIAGNOSIS — F321 Major depressive disorder, single episode, moderate: Secondary | ICD-10-CM | POA: Diagnosis not present

## 2021-04-13 DIAGNOSIS — F321 Major depressive disorder, single episode, moderate: Secondary | ICD-10-CM | POA: Diagnosis not present

## 2021-04-20 DIAGNOSIS — F321 Major depressive disorder, single episode, moderate: Secondary | ICD-10-CM | POA: Diagnosis not present

## 2021-04-26 DIAGNOSIS — M7582 Other shoulder lesions, left shoulder: Secondary | ICD-10-CM | POA: Diagnosis not present

## 2021-04-26 DIAGNOSIS — S8001XA Contusion of right knee, initial encounter: Secondary | ICD-10-CM | POA: Diagnosis not present

## 2021-04-26 DIAGNOSIS — M542 Cervicalgia: Secondary | ICD-10-CM | POA: Diagnosis not present

## 2021-04-26 DIAGNOSIS — S335XXA Sprain of ligaments of lumbar spine, initial encounter: Secondary | ICD-10-CM | POA: Diagnosis not present

## 2021-04-27 DIAGNOSIS — F321 Major depressive disorder, single episode, moderate: Secondary | ICD-10-CM | POA: Diagnosis not present

## 2021-04-28 DIAGNOSIS — F411 Generalized anxiety disorder: Secondary | ICD-10-CM | POA: Diagnosis not present

## 2021-04-28 DIAGNOSIS — F4311 Post-traumatic stress disorder, acute: Secondary | ICD-10-CM | POA: Diagnosis not present

## 2021-05-04 DIAGNOSIS — N898 Other specified noninflammatory disorders of vagina: Secondary | ICD-10-CM | POA: Diagnosis not present

## 2021-05-04 DIAGNOSIS — F321 Major depressive disorder, single episode, moderate: Secondary | ICD-10-CM | POA: Diagnosis not present

## 2021-05-04 DIAGNOSIS — Z113 Encounter for screening for infections with a predominantly sexual mode of transmission: Secondary | ICD-10-CM | POA: Diagnosis not present

## 2021-05-11 DIAGNOSIS — F321 Major depressive disorder, single episode, moderate: Secondary | ICD-10-CM | POA: Diagnosis not present

## 2021-05-18 DIAGNOSIS — F321 Major depressive disorder, single episode, moderate: Secondary | ICD-10-CM | POA: Diagnosis not present

## 2021-05-24 DIAGNOSIS — F411 Generalized anxiety disorder: Secondary | ICD-10-CM | POA: Diagnosis not present

## 2021-05-24 DIAGNOSIS — L309 Dermatitis, unspecified: Secondary | ICD-10-CM | POA: Diagnosis not present

## 2021-05-25 DIAGNOSIS — F321 Major depressive disorder, single episode, moderate: Secondary | ICD-10-CM | POA: Diagnosis not present

## 2021-06-01 DIAGNOSIS — F321 Major depressive disorder, single episode, moderate: Secondary | ICD-10-CM | POA: Diagnosis not present

## 2021-06-02 DIAGNOSIS — F4311 Post-traumatic stress disorder, acute: Secondary | ICD-10-CM | POA: Diagnosis not present

## 2021-06-02 DIAGNOSIS — F411 Generalized anxiety disorder: Secondary | ICD-10-CM | POA: Diagnosis not present

## 2021-06-07 DIAGNOSIS — M542 Cervicalgia: Secondary | ICD-10-CM | POA: Diagnosis not present

## 2021-06-07 DIAGNOSIS — M7582 Other shoulder lesions, left shoulder: Secondary | ICD-10-CM | POA: Diagnosis not present

## 2021-06-07 DIAGNOSIS — G5622 Lesion of ulnar nerve, left upper limb: Secondary | ICD-10-CM | POA: Diagnosis not present

## 2021-06-07 DIAGNOSIS — S335XXA Sprain of ligaments of lumbar spine, initial encounter: Secondary | ICD-10-CM | POA: Diagnosis not present

## 2021-06-07 DIAGNOSIS — S8001XA Contusion of right knee, initial encounter: Secondary | ICD-10-CM | POA: Diagnosis not present

## 2021-06-08 DIAGNOSIS — F321 Major depressive disorder, single episode, moderate: Secondary | ICD-10-CM | POA: Diagnosis not present

## 2021-06-15 DIAGNOSIS — F321 Major depressive disorder, single episode, moderate: Secondary | ICD-10-CM | POA: Diagnosis not present

## 2021-06-22 DIAGNOSIS — F321 Major depressive disorder, single episode, moderate: Secondary | ICD-10-CM | POA: Diagnosis not present

## 2021-06-29 DIAGNOSIS — F321 Major depressive disorder, single episode, moderate: Secondary | ICD-10-CM | POA: Diagnosis not present

## 2021-07-05 DIAGNOSIS — S335XXA Sprain of ligaments of lumbar spine, initial encounter: Secondary | ICD-10-CM | POA: Diagnosis not present

## 2021-07-05 DIAGNOSIS — M542 Cervicalgia: Secondary | ICD-10-CM | POA: Diagnosis not present

## 2021-07-05 DIAGNOSIS — M7582 Other shoulder lesions, left shoulder: Secondary | ICD-10-CM | POA: Diagnosis not present

## 2021-07-05 DIAGNOSIS — G5622 Lesion of ulnar nerve, left upper limb: Secondary | ICD-10-CM | POA: Diagnosis not present

## 2021-07-06 DIAGNOSIS — F321 Major depressive disorder, single episode, moderate: Secondary | ICD-10-CM | POA: Diagnosis not present

## 2021-07-12 DIAGNOSIS — F321 Major depressive disorder, single episode, moderate: Secondary | ICD-10-CM | POA: Diagnosis not present

## 2021-07-19 DIAGNOSIS — F322 Major depressive disorder, single episode, severe without psychotic features: Secondary | ICD-10-CM | POA: Diagnosis not present

## 2021-07-27 DIAGNOSIS — F322 Major depressive disorder, single episode, severe without psychotic features: Secondary | ICD-10-CM | POA: Diagnosis not present

## 2021-08-03 DIAGNOSIS — F322 Major depressive disorder, single episode, severe without psychotic features: Secondary | ICD-10-CM | POA: Diagnosis not present

## 2021-08-04 DIAGNOSIS — F411 Generalized anxiety disorder: Secondary | ICD-10-CM | POA: Diagnosis not present

## 2021-08-04 DIAGNOSIS — F4311 Post-traumatic stress disorder, acute: Secondary | ICD-10-CM | POA: Diagnosis not present

## 2021-08-10 DIAGNOSIS — F322 Major depressive disorder, single episode, severe without psychotic features: Secondary | ICD-10-CM | POA: Diagnosis not present

## 2021-08-17 DIAGNOSIS — F322 Major depressive disorder, single episode, severe without psychotic features: Secondary | ICD-10-CM | POA: Diagnosis not present

## 2021-08-26 DIAGNOSIS — F322 Major depressive disorder, single episode, severe without psychotic features: Secondary | ICD-10-CM | POA: Diagnosis not present

## 2021-08-31 DIAGNOSIS — F322 Major depressive disorder, single episode, severe without psychotic features: Secondary | ICD-10-CM | POA: Diagnosis not present

## 2021-09-07 DIAGNOSIS — F322 Major depressive disorder, single episode, severe without psychotic features: Secondary | ICD-10-CM | POA: Diagnosis not present

## 2021-09-14 DIAGNOSIS — F322 Major depressive disorder, single episode, severe without psychotic features: Secondary | ICD-10-CM | POA: Diagnosis not present

## 2021-09-21 DIAGNOSIS — F322 Major depressive disorder, single episode, severe without psychotic features: Secondary | ICD-10-CM | POA: Diagnosis not present

## 2021-09-28 DIAGNOSIS — F322 Major depressive disorder, single episode, severe without psychotic features: Secondary | ICD-10-CM | POA: Diagnosis not present

## 2021-09-29 DIAGNOSIS — F4311 Post-traumatic stress disorder, acute: Secondary | ICD-10-CM | POA: Diagnosis not present

## 2021-09-29 DIAGNOSIS — F411 Generalized anxiety disorder: Secondary | ICD-10-CM | POA: Diagnosis not present

## 2021-10-05 DIAGNOSIS — F322 Major depressive disorder, single episode, severe without psychotic features: Secondary | ICD-10-CM | POA: Diagnosis not present

## 2021-10-12 DIAGNOSIS — F322 Major depressive disorder, single episode, severe without psychotic features: Secondary | ICD-10-CM | POA: Diagnosis not present

## 2021-10-12 DIAGNOSIS — D259 Leiomyoma of uterus, unspecified: Secondary | ICD-10-CM | POA: Diagnosis not present

## 2021-10-12 DIAGNOSIS — Z113 Encounter for screening for infections with a predominantly sexual mode of transmission: Secondary | ICD-10-CM | POA: Diagnosis not present

## 2021-10-12 DIAGNOSIS — N939 Abnormal uterine and vaginal bleeding, unspecified: Secondary | ICD-10-CM | POA: Diagnosis not present

## 2021-10-19 DIAGNOSIS — F322 Major depressive disorder, single episode, severe without psychotic features: Secondary | ICD-10-CM | POA: Diagnosis not present

## 2021-10-26 DIAGNOSIS — F322 Major depressive disorder, single episode, severe without psychotic features: Secondary | ICD-10-CM | POA: Diagnosis not present

## 2021-11-02 DIAGNOSIS — F322 Major depressive disorder, single episode, severe without psychotic features: Secondary | ICD-10-CM | POA: Diagnosis not present

## 2021-11-09 DIAGNOSIS — F322 Major depressive disorder, single episode, severe without psychotic features: Secondary | ICD-10-CM | POA: Diagnosis not present

## 2021-11-16 DIAGNOSIS — F322 Major depressive disorder, single episode, severe without psychotic features: Secondary | ICD-10-CM | POA: Diagnosis not present

## 2021-11-23 DIAGNOSIS — F322 Major depressive disorder, single episode, severe without psychotic features: Secondary | ICD-10-CM | POA: Diagnosis not present

## 2021-11-24 DIAGNOSIS — F4311 Post-traumatic stress disorder, acute: Secondary | ICD-10-CM | POA: Diagnosis not present

## 2021-11-24 DIAGNOSIS — F411 Generalized anxiety disorder: Secondary | ICD-10-CM | POA: Diagnosis not present

## 2021-11-30 DIAGNOSIS — F322 Major depressive disorder, single episode, severe without psychotic features: Secondary | ICD-10-CM | POA: Diagnosis not present

## 2021-12-06 DIAGNOSIS — N939 Abnormal uterine and vaginal bleeding, unspecified: Secondary | ICD-10-CM | POA: Diagnosis not present

## 2021-12-07 DIAGNOSIS — F322 Major depressive disorder, single episode, severe without psychotic features: Secondary | ICD-10-CM | POA: Diagnosis not present

## 2021-12-16 DIAGNOSIS — F322 Major depressive disorder, single episode, severe without psychotic features: Secondary | ICD-10-CM | POA: Diagnosis not present

## 2021-12-21 DIAGNOSIS — F322 Major depressive disorder, single episode, severe without psychotic features: Secondary | ICD-10-CM | POA: Diagnosis not present

## 2021-12-29 DIAGNOSIS — F322 Major depressive disorder, single episode, severe without psychotic features: Secondary | ICD-10-CM | POA: Diagnosis not present

## 2022-01-04 DIAGNOSIS — F322 Major depressive disorder, single episode, severe without psychotic features: Secondary | ICD-10-CM | POA: Diagnosis not present

## 2022-01-11 DIAGNOSIS — F322 Major depressive disorder, single episode, severe without psychotic features: Secondary | ICD-10-CM | POA: Diagnosis not present

## 2022-01-18 DIAGNOSIS — F322 Major depressive disorder, single episode, severe without psychotic features: Secondary | ICD-10-CM | POA: Diagnosis not present

## 2022-01-26 DIAGNOSIS — F322 Major depressive disorder, single episode, severe without psychotic features: Secondary | ICD-10-CM | POA: Diagnosis not present

## 2022-02-01 DIAGNOSIS — F322 Major depressive disorder, single episode, severe without psychotic features: Secondary | ICD-10-CM | POA: Diagnosis not present

## 2022-02-08 DIAGNOSIS — F322 Major depressive disorder, single episode, severe without psychotic features: Secondary | ICD-10-CM | POA: Diagnosis not present

## 2022-02-14 DIAGNOSIS — N898 Other specified noninflammatory disorders of vagina: Secondary | ICD-10-CM | POA: Diagnosis not present

## 2022-02-14 DIAGNOSIS — R4689 Other symptoms and signs involving appearance and behavior: Secondary | ICD-10-CM | POA: Diagnosis not present

## 2022-02-15 DIAGNOSIS — F322 Major depressive disorder, single episode, severe without psychotic features: Secondary | ICD-10-CM | POA: Diagnosis not present

## 2022-02-22 DIAGNOSIS — F322 Major depressive disorder, single episode, severe without psychotic features: Secondary | ICD-10-CM | POA: Diagnosis not present

## 2022-02-23 DIAGNOSIS — F411 Generalized anxiety disorder: Secondary | ICD-10-CM | POA: Diagnosis not present

## 2022-02-23 DIAGNOSIS — F4311 Post-traumatic stress disorder, acute: Secondary | ICD-10-CM | POA: Diagnosis not present

## 2022-02-28 DIAGNOSIS — N939 Abnormal uterine and vaginal bleeding, unspecified: Secondary | ICD-10-CM | POA: Diagnosis not present

## 2022-02-28 DIAGNOSIS — Z86018 Personal history of other benign neoplasm: Secondary | ICD-10-CM | POA: Diagnosis not present

## 2022-02-28 DIAGNOSIS — Z01812 Encounter for preprocedural laboratory examination: Secondary | ICD-10-CM | POA: Diagnosis not present

## 2022-03-01 DIAGNOSIS — F322 Major depressive disorder, single episode, severe without psychotic features: Secondary | ICD-10-CM | POA: Diagnosis not present

## 2022-03-08 DIAGNOSIS — F322 Major depressive disorder, single episode, severe without psychotic features: Secondary | ICD-10-CM | POA: Diagnosis not present

## 2022-03-15 DIAGNOSIS — F322 Major depressive disorder, single episode, severe without psychotic features: Secondary | ICD-10-CM | POA: Diagnosis not present

## 2022-03-22 DIAGNOSIS — F322 Major depressive disorder, single episode, severe without psychotic features: Secondary | ICD-10-CM | POA: Diagnosis not present

## 2022-03-29 DIAGNOSIS — F322 Major depressive disorder, single episode, severe without psychotic features: Secondary | ICD-10-CM | POA: Diagnosis not present

## 2022-04-05 DIAGNOSIS — F322 Major depressive disorder, single episode, severe without psychotic features: Secondary | ICD-10-CM | POA: Diagnosis not present

## 2022-04-11 DIAGNOSIS — F322 Major depressive disorder, single episode, severe without psychotic features: Secondary | ICD-10-CM | POA: Diagnosis not present

## 2022-04-20 DIAGNOSIS — R21 Rash and other nonspecific skin eruption: Secondary | ICD-10-CM | POA: Diagnosis not present

## 2022-04-20 DIAGNOSIS — L209 Atopic dermatitis, unspecified: Secondary | ICD-10-CM | POA: Diagnosis not present

## 2022-04-26 DIAGNOSIS — F322 Major depressive disorder, single episode, severe without psychotic features: Secondary | ICD-10-CM | POA: Diagnosis not present

## 2022-05-03 DIAGNOSIS — F322 Major depressive disorder, single episode, severe without psychotic features: Secondary | ICD-10-CM | POA: Diagnosis not present

## 2022-05-08 DIAGNOSIS — L723 Sebaceous cyst: Secondary | ICD-10-CM | POA: Diagnosis not present

## 2022-05-10 DIAGNOSIS — F322 Major depressive disorder, single episode, severe without psychotic features: Secondary | ICD-10-CM | POA: Diagnosis not present

## 2022-05-11 DIAGNOSIS — N939 Abnormal uterine and vaginal bleeding, unspecified: Secondary | ICD-10-CM | POA: Diagnosis not present

## 2022-05-11 DIAGNOSIS — N898 Other specified noninflammatory disorders of vagina: Secondary | ICD-10-CM | POA: Diagnosis not present

## 2022-05-17 DIAGNOSIS — F322 Major depressive disorder, single episode, severe without psychotic features: Secondary | ICD-10-CM | POA: Diagnosis not present

## 2022-05-24 DIAGNOSIS — F322 Major depressive disorder, single episode, severe without psychotic features: Secondary | ICD-10-CM | POA: Diagnosis not present

## 2022-05-25 DIAGNOSIS — F411 Generalized anxiety disorder: Secondary | ICD-10-CM | POA: Diagnosis not present

## 2022-05-25 DIAGNOSIS — F4311 Post-traumatic stress disorder, acute: Secondary | ICD-10-CM | POA: Diagnosis not present

## 2022-05-31 DIAGNOSIS — F322 Major depressive disorder, single episode, severe without psychotic features: Secondary | ICD-10-CM | POA: Diagnosis not present

## 2022-06-07 DIAGNOSIS — F322 Major depressive disorder, single episode, severe without psychotic features: Secondary | ICD-10-CM | POA: Diagnosis not present

## 2022-06-14 DIAGNOSIS — F322 Major depressive disorder, single episode, severe without psychotic features: Secondary | ICD-10-CM | POA: Diagnosis not present

## 2022-06-21 DIAGNOSIS — F322 Major depressive disorder, single episode, severe without psychotic features: Secondary | ICD-10-CM | POA: Diagnosis not present

## 2022-06-29 DIAGNOSIS — F322 Major depressive disorder, single episode, severe without psychotic features: Secondary | ICD-10-CM | POA: Diagnosis not present

## 2022-07-05 DIAGNOSIS — F322 Major depressive disorder, single episode, severe without psychotic features: Secondary | ICD-10-CM | POA: Diagnosis not present

## 2022-07-12 DIAGNOSIS — F322 Major depressive disorder, single episode, severe without psychotic features: Secondary | ICD-10-CM | POA: Diagnosis not present
